# Patient Record
Sex: Female | Born: 1955 | Race: White | Hispanic: No | Marital: Married | Smoking: Never smoker
Health system: Southern US, Community
[De-identification: ages and names within clinical notes are randomized; demographics above are authoritative.]

## PROBLEM LIST (undated history)

## (undated) DIAGNOSIS — R011 Cardiac murmur, unspecified: Secondary | ICD-10-CM

## (undated) DIAGNOSIS — C801 Malignant (primary) neoplasm, unspecified: Secondary | ICD-10-CM

## (undated) DIAGNOSIS — H269 Unspecified cataract: Secondary | ICD-10-CM

## (undated) DIAGNOSIS — M1990 Unspecified osteoarthritis, unspecified site: Secondary | ICD-10-CM

## (undated) DIAGNOSIS — R55 Syncope and collapse: Secondary | ICD-10-CM

## (undated) HISTORY — DX: Cardiac murmur, unspecified: R01.1

## (undated) HISTORY — DX: Unspecified cataract: H26.9

## (undated) HISTORY — DX: Syncope and collapse: R55

## (undated) HISTORY — DX: Malignant (primary) neoplasm, unspecified: C80.1

## (undated) HISTORY — DX: Unspecified osteoarthritis, unspecified site: M19.90

---

## 2001-05-18 ENCOUNTER — Encounter: Admission: RE | Admit: 2001-05-18 | Discharge: 2001-05-18 | Payer: Self-pay | Admitting: *Deleted

## 2001-05-18 ENCOUNTER — Encounter: Payer: Self-pay | Admitting: *Deleted

## 2001-07-13 ENCOUNTER — Other Ambulatory Visit: Admission: RE | Admit: 2001-07-13 | Discharge: 2001-07-13 | Payer: Self-pay | Admitting: *Deleted

## 2003-09-24 ENCOUNTER — Encounter: Admission: RE | Admit: 2003-09-24 | Discharge: 2003-09-24 | Payer: Self-pay | Admitting: *Deleted

## 2006-01-19 ENCOUNTER — Emergency Department (HOSPITAL_COMMUNITY): Admission: EM | Admit: 2006-01-19 | Discharge: 2006-01-19 | Payer: Self-pay | Admitting: *Deleted

## 2006-05-24 ENCOUNTER — Encounter: Admission: RE | Admit: 2006-05-24 | Discharge: 2006-05-24 | Payer: Self-pay | Admitting: *Deleted

## 2006-05-31 ENCOUNTER — Other Ambulatory Visit: Admission: RE | Admit: 2006-05-31 | Discharge: 2006-05-31 | Payer: Self-pay | Admitting: *Deleted

## 2009-01-07 HISTORY — PX: COLONOSCOPY: SHX174

## 2009-01-11 ENCOUNTER — Encounter: Payer: Self-pay | Admitting: Internal Medicine

## 2009-02-01 ENCOUNTER — Ambulatory Visit (HOSPITAL_COMMUNITY): Admission: RE | Admit: 2009-02-01 | Discharge: 2009-02-01 | Payer: Self-pay | Admitting: Internal Medicine

## 2009-02-01 ENCOUNTER — Ambulatory Visit: Payer: Self-pay | Admitting: Internal Medicine

## 2009-02-14 ENCOUNTER — Encounter: Admission: RE | Admit: 2009-02-14 | Discharge: 2009-02-14 | Payer: Self-pay | Admitting: Family Medicine

## 2011-03-24 NOTE — Op Note (Signed)
NAME:  Claire [name], Claire [name]            ACCOUNT NO.:  [id number]   MEDICAL RECORD NO.:  [id number]          PATIENT TYPE:  AMB   LOCATION:  DAY                           FACILITY:  [facility]   PHYSICIAN:  R. Roetta Sessions, M.D. DATE OF BIRTH:  [birth year]   DATE OF PROCEDURE:  02/01/2009  DATE OF DISCHARGE:                               OPERATIVE REPORT   PROCEDURE PERFORMED:  Screening colonoscopy.   INDICATIONS FOR PROCEDURE:  55 year old lady sent over courtesy of Dr.  Lilyan Punt for colorectal cancer screening.  She is devoid of any  lower GI tract symptoms.  There was no family history of any polyps or  colon cancer in any first-degree relatives.  She has never had her lower  GI tract imaged previously.  Colonoscopy is now being done.  Risks,  benefits, alternatives and limitations have been reviewed, questions  answered.  She is agreeable.  Please see documentation in the medical  record.   PROCEDURE NOTE:  O2 saturation, blood pressure, pulse and respirations  were monitored throughout the entire procedure.   CONSCIOUS SEDATION:  Versed 5 mg IV, Demerol 100 mg IV in divided doses.   INSTRUMENT:  Pentax video chip system.   FINDINGS:  Digital rectal exam revealed no abnormalities.   ENDOSCOPIC FINDINGS:  The prep was good.  Colon:  Colonic mucosa was surveyed from rectosigmoid junction through  the left, transverse, right colon to the area of the appendiceal  orifice, ileocecal valve/cecum.  These structures were well seen and  photographed for the record.  From this level, the scope was slowly  withdrawn.  All previous mentioned mucosal surfaces were again seen and  the colonic mucosa appeared entirely normal.  Scope was pulled down to  the rectum where a thorough examination of the rectal mucosa including  retroflex view of the anal verge demonstrated no abnormalities.  The  patient tolerated the procedure well and was reacted in endoscopy.  The  cecal withdrawal time over 6  minutes.   IMPRESSION:  1. Normal rectum.  2. Normal colon.   RECOMMENDATIONS:  Repeat screening colonoscopy in 10 years.      Jonathon Bellows, M.D.  Electronically Signed     RMR/MEDQ  D:  02/01/2009  T:  02/01/2009  Job:  [id number]   cc:   Lorin Picket A. Gerda Diss, MD  Fax: [phone]

## 2011-06-30 ENCOUNTER — Other Ambulatory Visit: Payer: Self-pay | Admitting: Family Medicine

## 2011-06-30 DIAGNOSIS — Z1231 Encounter for screening mammogram for malignant neoplasm of breast: Secondary | ICD-10-CM

## 2011-07-01 ENCOUNTER — Ambulatory Visit
Admission: RE | Admit: 2011-07-01 | Discharge: 2011-07-01 | Disposition: A | Discharge status: Home / Self Care | Payer: BC Managed Care – PPO | Source: Ambulatory Visit | Attending: Family Medicine | Admitting: Family Medicine

## 2011-07-01 DIAGNOSIS — Z1231 Encounter for screening mammogram for malignant neoplasm of breast: Secondary | ICD-10-CM

## 2013-01-14 ENCOUNTER — Encounter: Payer: Self-pay | Admitting: *Deleted

## 2013-05-05 ENCOUNTER — Encounter: Payer: Self-pay | Admitting: Family Medicine

## 2013-05-05 ENCOUNTER — Ambulatory Visit: Payer: Self-pay | Admitting: Nurse Practitioner

## 2013-05-05 ENCOUNTER — Ambulatory Visit (INDEPENDENT_AMBULATORY_CARE_PROVIDER_SITE_OTHER): Payer: No Typology Code available for payment source | Admitting: Family Medicine

## 2013-05-05 VITALS — BP 132/74 | Temp 97.6°F | Wt 132.1 lb

## 2013-05-05 DIAGNOSIS — J329 Chronic sinusitis, unspecified: Secondary | ICD-10-CM

## 2013-05-05 MED ORDER — AMOXICILLIN-POT CLAVULANATE 875-125 MG PO TABS: 1.0000 | ORAL_TABLET | Freq: Two times a day (BID) | ORAL | Status: AC

## 2013-05-05 NOTE — Progress Notes (Signed)
  Subjective:    Patient ID: Claire [name], female    DOB: 02-Apr-1956, 57 y.o.   MRN: [id number]  Headache  This is a new problem. The current episode started 1 to 4 weeks ago. The problem has been gradually worsening. The pain is located in the retro-orbital region. The pain radiates to the face. The pain quality is not similar to prior headaches. The quality of the pain is described as aching. The pain is at a severity of 5/10. The pain is moderate. Associated symptoms include facial sweating. She has tried nothing for the symptoms. The treatment provided moderate relief. There is no history of cluster headaches.   occas drainage. Doesn't feel like a hormnal headache. Not as bad now . Right now less congestion  More pressure I guess.  Not pounding. No assoc nausea  Not as clear, not as good vision.   Review of Systems  Neurological: Positive for headaches.   some drainage. No chest pain no sore throat no fever good energy ROS otherwise negative     Objective:   Physical Exam  Alert no acute distress. HEENT frontal and maxillary tenderness on right side. Slight nasal congestion. Pharynx normal neck supple. Lungs clear. Heart regular rate and rhythm.      Assessment & Plan:  Impression 1 rhinosinusitis. Plan Augmentin twice a day 10 days. Symptomatic care discussed. Expect gradual resolution. WSL

## 2016-04-23 ENCOUNTER — Ambulatory Visit (INDEPENDENT_AMBULATORY_CARE_PROVIDER_SITE_OTHER): Payer: No Typology Code available for payment source | Admitting: Nurse Practitioner

## 2016-04-23 ENCOUNTER — Emergency Department (HOSPITAL_COMMUNITY): Payer: No Typology Code available for payment source

## 2016-04-23 ENCOUNTER — Encounter: Payer: Self-pay | Admitting: Nurse Practitioner

## 2016-04-23 ENCOUNTER — Observation Stay (HOSPITAL_COMMUNITY)
Admission: EM | Admit: 2016-04-23 | Discharge: 2016-04-24 | Disposition: A | Discharge status: Home / Self Care | Payer: No Typology Code available for payment source | Attending: General Surgery | Admitting: General Surgery

## 2016-04-23 ENCOUNTER — Encounter (HOSPITAL_COMMUNITY): Payer: Self-pay

## 2016-04-23 VITALS — BP 136/76 | Temp 98.6°F | Wt 126.5 lb

## 2016-04-23 DIAGNOSIS — Z803 Family history of malignant neoplasm of breast: Secondary | ICD-10-CM | POA: Diagnosis not present

## 2016-04-23 DIAGNOSIS — R1011 Right upper quadrant pain: Secondary | ICD-10-CM | POA: Diagnosis not present

## 2016-04-23 DIAGNOSIS — K8000 Calculus of gallbladder with acute cholecystitis without obstruction: Principal | ICD-10-CM | POA: Insufficient documentation

## 2016-04-23 DIAGNOSIS — Z800 Family history of malignant neoplasm of digestive organs: Secondary | ICD-10-CM | POA: Diagnosis not present

## 2016-04-23 DIAGNOSIS — K810 Acute cholecystitis: Secondary | ICD-10-CM | POA: Diagnosis present

## 2016-04-23 LAB — COMPREHENSIVE METABOLIC PANEL
ALBUMIN: 4.4 g/dL (ref 3.5–5.0)
ALT: 43 U/L (ref 14–54)
ANION GAP: 9 (ref 5–15)
AST: 28 U/L (ref 15–41)
Alkaline Phosphatase: 43 U/L (ref 38–126)
BUN: 12 mg/dL (ref 6–20)
CHLORIDE: 98 mmol/L — AB (ref 101–111)
CO2: 27 mmol/L (ref 22–32)
Calcium: 9.1 mg/dL (ref 8.9–10.3)
Creatinine, Ser: 0.56 mg/dL (ref 0.44–1.00)
GFR calc Af Amer: >60 mL/min (ref >60)
GFR calc non Af Amer: >60 mL/min (ref >60)
GLUCOSE: 117 mg/dL — AB (ref 65–99)
POTASSIUM: 3.7 mmol/L (ref 3.5–5.1)
SODIUM: 134 mmol/L — AB (ref 135–145)
TOTAL PROTEIN: 7.6 g/dL (ref 6.5–8.1)
Total Bilirubin: 0.7 mg/dL (ref 0.3–1.2)

## 2016-04-23 LAB — CBC WITH DIFFERENTIAL/PLATELET
BASOS ABS: 0 10*3/uL (ref 0.0–0.1)
BASOS PCT: 0 %
EOS ABS: 0 10*3/uL (ref 0.0–0.7)
Eosinophils Relative: 0 %
HCT: 41.7 % (ref 36.0–46.0)
Hemoglobin: 13.7 g/dL (ref 12.0–15.0)
Lymphocytes Relative: 7 %
Lymphs Abs: 0.8 10*3/uL (ref 0.7–4.0)
MCH: 28.9 pg (ref 26.0–34.0)
MCHC: 32.9 g/dL (ref 30.0–36.0)
MCV: 88 fL (ref 78.0–100.0)
MONO ABS: 0.2 10*3/uL (ref 0.1–1.0)
MONOS PCT: 2 %
NEUTROS ABS: 10.2 10*3/uL — AB (ref 1.7–7.7)
Neutrophils Relative %: 91 %
PLATELETS: 226 10*3/uL (ref 150–400)
RBC: 4.74 MIL/uL (ref 3.87–5.11)
RDW: 13.2 % (ref 11.5–15.5)
WBC: 11.3 10*3/uL — ABNORMAL HIGH (ref 4.0–10.5)

## 2016-04-23 LAB — URINALYSIS, ROUTINE W REFLEX MICROSCOPIC
Bilirubin Urine: NEGATIVE
GLUCOSE, UA: NEGATIVE mg/dL
KETONES UR: 40 mg/dL — AB
LEUKOCYTES UA: NEGATIVE
NITRITE: NEGATIVE
PROTEIN: 100 mg/dL — AB
Specific Gravity, Urine: >1.03 — ABNORMAL HIGH (ref 1.005–1.030)
pH: 6 (ref 5.0–8.0)

## 2016-04-23 LAB — URINE MICROSCOPIC-ADD ON

## 2016-04-23 LAB — LIPASE, BLOOD: Lipase: 24 U/L (ref 11–51)

## 2016-04-23 MED ORDER — MORPHINE SULFATE (PF) 4 MG/ML IV SOLN
4.0000 mg | INTRAVENOUS | Status: DC | PRN
  Administered 2016-04-23: 4 mg via INTRAVENOUS
  Filled 2016-04-23: qty 1

## 2016-04-23 MED ORDER — MORPHINE SULFATE (PF) 4 MG/ML IV SOLN
4.0000 mg | INTRAVENOUS | Status: AC | PRN
  Administered 2016-04-23: 4 mg via INTRAVENOUS
  Filled 2016-04-23: qty 1

## 2016-04-23 MED ORDER — SODIUM CHLORIDE 0.9 % IV SOLN
INTRAVENOUS | Status: AC
  Administered 2016-04-23: 21:00:00 via INTRAVENOUS

## 2016-04-23 MED ORDER — ACETAMINOPHEN 325 MG PO TABS: 650.0000 mg | ORAL_TABLET | Freq: Four times a day (QID) | ORAL | Status: DC | PRN

## 2016-04-23 MED ORDER — ONDANSETRON HCL 4 MG/2ML IJ SOLN
4.0000 mg | Freq: Three times a day (TID) | INTRAMUSCULAR | Status: AC | PRN
Start: 2016-04-23 — End: 2016-04-24

## 2016-04-23 MED ORDER — CIPROFLOXACIN IN D5W 400 MG/200ML IV SOLN
400.0000 mg | Freq: Once | INTRAVENOUS | Status: AC
  Administered 2016-04-23: 400 mg via INTRAVENOUS
  Filled 2016-04-23: qty 200

## 2016-04-23 MED ORDER — SODIUM CHLORIDE 0.9 % IV SOLN
INTRAVENOUS | Status: DC
  Administered 2016-04-23 – 2016-04-24 (×3): via INTRAVENOUS

## 2016-04-23 MED ORDER — ONDANSETRON HCL 4 MG/2ML IJ SOLN
4.0000 mg | INTRAMUSCULAR | Status: AC | PRN
  Administered 2016-04-23 (×2): 4 mg via INTRAVENOUS
  Filled 2016-04-23 (×2): qty 2

## 2016-04-23 MED ORDER — METRONIDAZOLE IN NACL 5-0.79 MG/ML-% IV SOLN
500.0000 mg | Freq: Three times a day (TID) | INTRAVENOUS | Status: DC
  Administered 2016-04-24 (×2): 500 mg via INTRAVENOUS
  Filled 2016-04-23 (×3): qty 100

## 2016-04-23 MED ORDER — CIPROFLOXACIN IN D5W 400 MG/200ML IV SOLN
400.0000 mg | Freq: Two times a day (BID) | INTRAVENOUS | Status: DC
  Administered 2016-04-24: 400 mg via INTRAVENOUS
  Filled 2016-04-23 (×2): qty 200

## 2016-04-23 MED ORDER — HYDROMORPHONE HCL 1 MG/ML IJ SOLN
1.0000 mg | INTRAMUSCULAR | Status: AC | PRN
  Administered 2016-04-24 (×2): 1 mg via INTRAVENOUS
  Filled 2016-04-23 (×2): qty 1

## 2016-04-23 MED ORDER — METRONIDAZOLE IN NACL 5-0.79 MG/ML-% IV SOLN
500.0000 mg | Freq: Once | INTRAVENOUS | Status: AC
  Administered 2016-04-23: 500 mg via INTRAVENOUS
  Filled 2016-04-23: qty 100

## 2016-04-23 MED ORDER — ACETAMINOPHEN 650 MG RE SUPP: 650.0000 mg | Freq: Four times a day (QID) | RECTAL | Status: DC | PRN

## 2016-04-23 MED ORDER — FAMOTIDINE IN NACL 20-0.9 MG/50ML-% IV SOLN
20.0000 mg | Freq: Once | INTRAVENOUS | Status: AC
  Administered 2016-04-23: 20 mg via INTRAVENOUS
  Filled 2016-04-23: qty 50

## 2016-04-23 NOTE — Progress Notes (Signed)
Subjective:  Presents with her husband for c/o upper abd pain especially in the RUQ that began suddenly at 5 pm yesterday. Several episodes of vomiting, mostly bile and phlegm. No known contacts. No fever. No back pain. BMs normal. No urinary symptoms. Postmenopausal, no cycle.   Objective:   BP 136/76 mmHg  Temp(Src) 98.6 F (37 C) (Oral)  Wt 126 lb 8 oz (57.38 kg) NAD. Alert, oriented. Lungs clear. Heart RRR. Abdomen soft, non distended with hypoactive BS x 4. Moderate epigastric area tenderness. Distinct RUQ tenderness even with light palpation.   Assessment: Right upper quadrant pain  Plan: Feel patient needs stat labs and work up. Referred to local ED for evaluation of acute abdominal pain. She and her husband agree with this plan.

## 2016-04-23 NOTE — ED Notes (Addendum)
Pt sent from [place].  Provider reports pt c/o severe ruq pain and is tender to touch.  Reports n/v.  Pt says symptoms started around 5:00pm yesterday.  Provider says she is unable to get back stat labs this late in the day and thinks pt may need some IV fluid.  Denies diarrhea.

## 2016-04-23 NOTE — ED Provider Notes (Signed)
CSN: [id number]     Arrival date & time 04/23/16  1513 History   First MD Initiated Contact with Patient 04/23/16 1525     No chief complaint on file.    HPI Pt was seen at 1525.  Per pt, c/o gradual onset and worsening of constant RUQ abd "pain" since yesterday evening.  Has been associated with multiple intermittent episodes of N/V.  Describes the abd pain as "sharp." Pt has been unable to tol PO due to her symptoms. Denies diarrhea, no fevers, no back pain, no rash, no CP/SOB, no black or blood in stools. Pt was evaluated by her PMD PTA, then sent to the ED for further evaluation.      History reviewed. No pertinent past medical history.   Past Surgical History  Procedure Laterality Date  . Colonoscopy  March 2010    normal repeat 10 years  . Cesarean section     Family History  Problem Relation Age of Onset  . Hyperlipidemia Father   . Cancer Maternal Grandmother     breast  . Cancer Paternal Grandfather     colon   Social History  Substance Use Topics  . Smoking status: Never Smoker   . Smokeless tobacco: None  . Alcohol Use: No    Review of Systems ROS: Statement: All systems negative except as marked or noted in the HPI; Constitutional: Negative for fever and chills. ; ; Eyes: Negative for eye pain, redness and discharge. ; ; ENMT: Negative for ear pain, hoarseness, nasal congestion, sinus pressure and sore throat. ; ; Cardiovascular: Negative for chest pain, palpitations, diaphoresis, dyspnea and peripheral edema. ; ; Respiratory: Negative for cough, wheezing and stridor. ; ; Gastrointestinal: +N/V, abd pain. Negative for diarrhea, blood in stool, hematemesis, jaundice and rectal bleeding. . ; ; Genitourinary: Negative for dysuria, flank pain and hematuria. ; ; Musculoskeletal: Negative for back pain and neck pain. Negative for swelling and trauma.; ; Skin: Negative for pruritus, rash, abrasions, blisters, bruising and skin lesion.; ; Neuro: Negative for headache,  lightheadedness and neck stiffness. Negative for weakness, altered level of consciousness, altered mental status, extremity weakness, paresthesias, involuntary movement, seizure and syncope.     Allergies  Review of patient's allergies indicates no known allergies.  Home Medications   Prior to Admission medications   Medication Sig Start Date End Date Taking? Authorizing Provider  COD LIVER OIL PO Take by mouth. Reported on 04/23/2016    Historical Provider, MD  fish oil-omega-3 fatty acids 1000 MG capsule Take 1 g by mouth daily.    Historical Provider, MD  GLUCOSAMINE PO Take by mouth.    Historical Provider, MD  Multiple Vitamin (MULTIVITAMIN) tablet Take 1 tablet by mouth daily.    Historical Provider, MD   BP 136/64 mmHg  Pulse 70  Temp(Src) 98.7 F (37.1 C) (Oral)  Resp 16  Ht 5\' 2"  (1.575 m)  Wt 126 lb (57.153 kg)  BMI 23.04 kg/m2  SpO2 96% Physical Exam  1530: Physical examination:  Nursing notes reviewed; Vital signs and O2 SAT reviewed;  Constitutional: Well developed, Well nourished, Well hydrated, Uncomfortable appearing.; Head:  Normocephalic, atraumatic; Eyes: EOMI, PERRL, No scleral icterus; ENMT: Mouth and pharynx normal, Mucous membranes moist; Neck: Supple, Full range of motion, No lymphadenopathy; Cardiovascular: Regular rate and rhythm, No gallop; Respiratory: Breath sounds clear & equal bilaterally, No wheezes.  Speaking full sentences with ease, Normal respiratory effort/excursion; Chest: Nontender, Movement normal; Abdomen: Soft, +RUQ and mid-epigastric areas tender to palp.  Nondistended, Normal bowel sounds; Genitourinary: No CVA tenderness; Extremities: Pulses normal, No tenderness, No edema, No calf edema or asymmetry.; Neuro: AA&Ox3, Major CN grossly intact.  Speech clear. No gross focal motor or sensory deficits in extremities.; Skin: Color normal, Warm, Dry.   ED Course  Procedures (including critical care time) Labs Review   Imaging Review  I have  personally reviewed and evaluated these images and lab results as part of my medical decision-making.   EKG Interpretation None      MDM  MDM Reviewed: previous chart, nursing note and vitals Reviewed previous: labs Interpretation: labs, ultrasound and x-ray     Results for orders placed or performed during the hospital encounter of 04/23/16  CBC with Differential  Result Value Ref Range   WBC 11.3 (H) 4.0 - 10.5 K/uL   RBC 4.74 3.87 - 5.11 MIL/uL   Hemoglobin 13.7 12.0 - 15.0 g/dL   HCT 41.7 36.0 - 46.0 %   MCV 88.0 78.0 - 100.0 fL   MCH 28.9 26.0 - 34.0 pg   MCHC 32.9 30.0 - 36.0 g/dL   RDW 13.2 11.5 - 15.5 %   Platelets 226 150 - 400 K/uL   Neutrophils Relative % 91 %   Neutro Abs 10.2 (H) 1.7 - 7.7 K/uL   Lymphocytes Relative 7 %   Lymphs Abs 0.8 0.7 - 4.0 K/uL   Monocytes Relative 2 %   Monocytes Absolute 0.2 0.1 - 1.0 K/uL   Eosinophils Relative 0 %   Eosinophils Absolute 0.0 0.0 - 0.7 K/uL   Basophils Relative 0 %   Basophils Absolute 0.0 0.0 - 0.1 K/uL  Comprehensive metabolic panel  Result Value Ref Range   Sodium 134 (L) 135 - 145 mmol/L   Potassium 3.7 3.5 - 5.1 mmol/L   Chloride 98 (L) 101 - 111 mmol/L   CO2 27 22 - 32 mmol/L   Glucose, Bld 117 (H) 65 - 99 mg/dL   BUN 12 6 - 20 mg/dL   Creatinine, Ser 0.56 0.44 - 1.00 mg/dL   Calcium 9.1 8.9 - 10.3 mg/dL   Total Protein 7.6 6.5 - 8.1 g/dL   Albumin 4.4 3.5 - 5.0 g/dL   AST 28 15 - 41 U/L   ALT 43 14 - 54 U/L   Alkaline Phosphatase 43 38 - 126 U/L   Total Bilirubin 0.7 0.3 - 1.2 mg/dL   GFR calc non Af Amer >60 >60 mL/min   GFR calc Af Amer >60 >60 mL/min   Anion gap 9 5 - 15  Lipase, blood  Result Value Ref Range   Lipase 24 11 - 51 U/L   Dg Chest 2 View 04/23/2016  CLINICAL DATA:  Severe right upper quadrant pain EXAM: CHEST  2 VIEW COMPARISON:  None. FINDINGS: The heart size and mediastinal contours are within normal limits. Both lungs are clear. The visualized skeletal structures are  unremarkable. IMPRESSION: No active cardiopulmonary disease. Electronically Signed   By: Inez Catalina M.D.   On: 04/23/2016 17:15   US Abdomen Complete 04/23/2016  CLINICAL DATA:  RIGHT upper quadrant abdominal pain for 24 hours, nausea, vomiting EXAM: ABDOMEN ULTRASOUND COMPLETE COMPARISON:  None FINDINGS: Gallbladder: 27 mm diameter non mobile calculus at lower gallbladder segment/neck. Associated gallbladder wall thickening, pericholecystic fluid and sonographic Murphy sign. Small amount of sludge within gallbladder. Findings consistent with acute cholecystitis. Common bile duct: Diameter: Not adequately visualized due to shadowing from gallstone at lower gallbladder segment/neck. Liver: Normal parenchymal echogenicity. Minimal intrahepatic  biliary dilatation. No focal hepatic mass. Hepatopetal portal venous flow. IVC: Normal appearance Pancreas: Normal appearance Spleen: Normal appearance, 6.7 cm length Right Kidney: Length: 11.3 cm. Normal morphology without mass or hydronephrosis. Left Kidney: Length: 11.8 cm. Normal morphology without mass or hydronephrosis. Abdominal aorta: Normal caliber Other findings: No free fluid IMPRESSION: Cholelithiasis with gallbladder sludge, gallbladder wall thickening, pericholecystic fluid and sonographic Murphy sign consistent with acute cholecystitis. Suboptimal visualization of extrahepatic biliary tree/CBD though mild intrahepatic biliary dilatation is identified; recommend correlation with LFTs. Electronically Signed   By: Lavonia Dana M.D.   On: 04/23/2016 17:13    1745:  [place] with acute cholecystitis; IV abx ordered. Dx and testing d/w pt and family.  Questions answered.  Verb understanding, agreeable to admit. T/C to General Surgery Dr. Arnoldo Morale, case discussed, including:  HPI, pertinent PM/SHx, VS/PE, dx testing, ED course and treatment:  Agreeable to consult, requests to admit to [facility]. T/C to [facility] Dr. Nehemiah Settle, case discussed, including:  HPI, pertinent  PM/SHx, VS/PE, dx testing, ED course and treatment:  Agreeable to admit, requests to write temporary orders, obtain medical bed to team [facility].   Francine Graven, DO 04/25/16 0117

## 2016-04-23 NOTE — H&P (Signed)
History and Physical  [name] [id number] DOB: 1956/02/15 DOA: 04/23/2016  Referring physician: Dr Thurnell Garbe, ED physician PCP: Sallee Lange, MD  Outpatient Specialists: none  Chief Complaint: Right upper quadrant pain  HPI: Claire [name] is a 60 y.o. female who is otherwise healthy. Patient presents with right upper quadrant pain that started yesterday and gradually became worse today. No palliating factors other than pain medicine given in the emergency department. No provoking factors. No nausea and vomiting, although patient does not feel like eating. Denies fevers. Denies chest pain, shortness of breath.  Patient leads a fairly active lifestyle and runs up to a few times a week.   Review of Systems:   Pt denies any fevers, chills, nausea, vomiting, diarrhea, constipation, abdominal pain, shortness of breath, dyspnea on exertion, orthopnea, cough, wheezing, palpitations, headache, vision changes, lightheadedness, dizziness, melena, rectal bleeding.  Review of systems are otherwise negative  History reviewed. No pertinent past medical history. Past Surgical History  Procedure Laterality Date  . Colonoscopy  March 2010    normal repeat 10 years  . Cesarean section     Social History:  reports that she has never smoked. She does not have any smokeless tobacco history on file. She reports that she does not drink alcohol or use illicit drugs. Patient lives at Home  No Known Allergies  Family History  Problem Relation Age of Onset  . Hyperlipidemia Father   . Cancer Maternal Grandmother     breast  . Cancer Paternal Grandfather     colon     Prior to Admission medications   Medication Sig Start Date End Date Taking? Authorizing Provider  fish oil-omega-3 fatty acids 1000 MG capsule Take 1 g by mouth daily.   Yes Historical Provider, MD  GLUCOSAMINE PO Take 1 tablet by mouth 2 (two) times daily.    Yes Historical Provider, MD  Multiple Vitamin (MULTIVITAMIN)  tablet Take 1 tablet by mouth daily.   Yes Historical Provider, MD    Physical Exam: BP 140/77 mmHg  Pulse 71  Temp(Src) 98.7 F (37.1 C) (Oral)  Resp 16  Ht 5\' 2"  (1.575 m)  Wt 57.153 kg (126 lb)  BMI 23.04 kg/m2  SpO2 99%  General: Middle-aged Caucasian female. Awake and alert and oriented x3. No acute cardiopulmonary distress.  HEENT: Normocephalic atraumatic.  Right and left ears normal in appearance.  Pupils equal, round, reactive to light. Extraocular muscles are intact. Sclerae anicteric and noninjected.  Moist mucosal membranes. No mucosal lesions.  Neck: Neck supple without lymphadenopathy. No carotid bruits. No masses palpated.  Cardiovascular: Regular rate with normal S1-S2 sounds. 3/6 systolic ejection murmur. No rubs, gallops auscultated. No JVD.  Respiratory: Good respiratory effort with no wheezes, rales, rhonchi. Lungs clear to auscultation bilaterally.  No accessory muscle use. Abdomen: Soft, tender in the right upper quadrant with guarding. Nondistended. No rebound tenderness. Active bowel sounds. No masses or hepatosplenomegaly  Skin: No rashes, lesions, or ulcerations.  Dry, warm to touch. 2+ dorsalis pedis and radial pulses. Musculoskeletal: No calf or leg pain. All major joints not erythematous nontender.  No upper or lower joint deformation.  Good ROM.  No contractures  Psychiatric: Intact judgment and insight. Pleasant and cooperative. Neurologic: No focal neurological deficits. Strength is 5/5 and symmetric in upper and lower extremities.  Cranial nerves II through XII are grossly intact.           Labs on Admission: I have personally reviewed following labs and imaging studies  CBC:  Recent Labs Lab 04/23/16 1547  WBC 11.3*  NEUTROABS 10.2*  HGB 13.7  HCT 41.7  MCV 88.0  PLT [id number]   Basic Metabolic Panel:  Recent Labs Lab 04/23/16 1547  NA 134*  K 3.7  CL 98*  CO2 27  GLUCOSE 117*  BUN 12  CREATININE 0.56  CALCIUM 9.1   GFR: Estimated  Creatinine Clearance: 60.6 mL/min (by C-G formula based on Cr of 0.56). Liver Function Tests:  Recent Labs Lab 04/23/16 1547  AST 28  ALT 43  ALKPHOS 43  BILITOT 0.7  PROT 7.6  ALBUMIN 4.4    Recent Labs Lab 04/23/16 1547  LIPASE 24   No results for input(s): AMMONIA in the last 168 hours. Coagulation Profile: No results for input(s): INR, PROTIME in the last 168 hours. Cardiac Enzymes: No results for input(s): CKTOTAL, CKMB, CKMBINDEX, TROPONINI in the last 168 hours. BNP (last 3 results) No results for input(s): PROBNP in the last 8760 hours. HbA1C: No results for input(s): HGBA1C in the last 72 hours. CBG: No results for input(s): GLUCAP in the last 168 hours. Lipid Profile: No results for input(s): CHOL, HDL, LDLCALC, TRIG, CHOLHDL, LDLDIRECT in the last 72 hours. Thyroid Function Tests: No results for input(s): TSH, T4TOTAL, FREET4, T3FREE, THYROIDAB in the last 72 hours. Anemia Panel: No results for input(s): VITAMINB12, FOLATE, FERRITIN, TIBC, IRON, RETICCTPCT in the last 72 hours. Urine analysis:    Component Value Date/Time   COLORURINE YELLOW 04/23/2016 [place] 04/23/2016 1645   LABSPEC >1.030* 04/23/2016 1645   PHURINE 6.0 04/23/2016 [place] 04/23/2016 1645   HGBUR SMALL* 04/23/2016 [place] 04/23/2016 1645   KETONESUR 40* 04/23/2016 1645   PROTEINUR 100* 04/23/2016 1645   NITRITE NEGATIVE 04/23/2016 1645   LEUKOCYTESUR NEGATIVE 04/23/2016 1645   Sepsis Labs: @LABRCNTIP (procalcitonin:4,lacticidven:4) )No results found for this or any previous visit (from the past 240 hour(s)).   Radiological Exams on Admission: Dg Chest 2 View  04/23/2016  CLINICAL DATA:  Severe right upper quadrant pain EXAM: CHEST  2 VIEW COMPARISON:  None. FINDINGS: The heart size and mediastinal contours are within normal limits. Both lungs are clear. The visualized skeletal structures are unremarkable. IMPRESSION: No active  cardiopulmonary disease. Electronically Signed   By: Inez Catalina M.D.   On: 04/23/2016 17:15   US Abdomen Complete  04/23/2016  CLINICAL DATA:  RIGHT upper quadrant abdominal pain for 24 hours, nausea, vomiting EXAM: ABDOMEN ULTRASOUND COMPLETE COMPARISON:  None FINDINGS: Gallbladder: 27 mm diameter non mobile calculus at lower gallbladder segment/neck. Associated gallbladder wall thickening, pericholecystic fluid and sonographic Murphy sign. Small amount of sludge within gallbladder. Findings consistent with acute cholecystitis. Common bile duct: Diameter: Not adequately visualized due to shadowing from gallstone at lower gallbladder segment/neck. Liver: Normal parenchymal echogenicity. Minimal intrahepatic biliary dilatation. No focal hepatic mass. Hepatopetal portal venous flow. IVC: Normal appearance Pancreas: Normal appearance Spleen: Normal appearance, 6.7 cm length Right Kidney: Length: 11.3 cm. Normal morphology without mass or hydronephrosis. Left Kidney: Length: 11.8 cm. Normal morphology without mass or hydronephrosis. Abdominal aorta: Normal caliber Other findings: No free fluid IMPRESSION: Cholelithiasis with gallbladder sludge, gallbladder wall thickening, pericholecystic fluid and sonographic Murphy sign consistent with acute cholecystitis. Suboptimal visualization of extrahepatic biliary tree/CBD though mild intrahepatic biliary dilatation is identified; recommend correlation with LFTs. Electronically Signed   By: Lavonia Dana M.D.   On: 04/23/2016 17:13    Assessment/Plan: Active Problems:   Acute cholecystitis  This patient was discussed with the ED physician, including pertinent vitals, physical exam findings, labs, and imaging.  We also discussed care given by the ED provider.  #1 acute cholecystitis  Admit  Cipro, Flagyl  Repeat blood work in the morning  EKG  Nothing by mouth after midnight for surgery tomorrow  DVT prophylaxis: SCDs Consultants: Gen. surgery Code  Status: Full code Family Communication: None  Disposition Plan: Admit   Truett Mainland, DO [facility] Pager [phone]  If 7PM-7AM, please contact night-coverage [email or web address] Password [email or web address]

## 2016-04-24 ENCOUNTER — Observation Stay (HOSPITAL_COMMUNITY): Payer: No Typology Code available for payment source | Admitting: Anesthesiology

## 2016-04-24 ENCOUNTER — Encounter (HOSPITAL_COMMUNITY): Payer: Self-pay | Admitting: *Deleted

## 2016-04-24 ENCOUNTER — Encounter (HOSPITAL_COMMUNITY)
Admission: EM | Disposition: A | Discharge status: Home / Self Care | Payer: Self-pay | Source: Home / Self Care | Attending: Emergency Medicine

## 2016-04-24 HISTORY — PX: CHOLECYSTECTOMY: SHX55

## 2016-04-24 LAB — CBC
HEMATOCRIT: 38.5 % (ref 36.0–46.0)
Hemoglobin: 12.7 g/dL (ref 12.0–15.0)
MCH: 29.5 pg (ref 26.0–34.0)
MCHC: 33 g/dL (ref 30.0–36.0)
MCV: 89.5 fL (ref 78.0–100.0)
PLATELETS: 203 10*3/uL (ref 150–400)
RBC: 4.3 MIL/uL (ref 3.87–5.11)
RDW: 13.4 % (ref 11.5–15.5)
WBC: 11.2 10*3/uL — ABNORMAL HIGH (ref 4.0–10.5)

## 2016-04-24 LAB — BASIC METABOLIC PANEL
Anion gap: 7 (ref 5–15)
BUN: 9 mg/dL (ref 6–20)
CHLORIDE: 103 mmol/L (ref 101–111)
CO2: 28 mmol/L (ref 22–32)
CREATININE: 0.47 mg/dL (ref 0.44–1.00)
Calcium: 8.2 mg/dL — ABNORMAL LOW (ref 8.9–10.3)
GFR calc Af Amer: >60 mL/min (ref >60)
GFR calc non Af Amer: >60 mL/min (ref >60)
GLUCOSE: 122 mg/dL — AB (ref 65–99)
POTASSIUM: 3.8 mmol/L (ref 3.5–5.1)
SODIUM: 138 mmol/L (ref 135–145)

## 2016-04-24 LAB — SURGICAL PCR SCREEN
MRSA, PCR: NEGATIVE
Staphylococcus aureus: NEGATIVE

## 2016-04-24 SURGERY — LAPAROSCOPIC CHOLECYSTECTOMY
Anesthesia: General

## 2016-04-24 MED ORDER — GLYCOPYRROLATE 0.2 MG/ML IJ SOLN
INTRAMUSCULAR | Status: DC | PRN
  Administered 2016-04-24: 0.6 mg via INTRAVENOUS

## 2016-04-24 MED ORDER — FENTANYL CITRATE (PF) 100 MCG/2ML IJ SOLN: 25.0000 ug | INTRAMUSCULAR | Status: DC | PRN

## 2016-04-24 MED ORDER — ONDANSETRON HCL 4 MG/2ML IJ SOLN
INTRAMUSCULAR | Status: AC
  Filled 2016-04-24: qty 2

## 2016-04-24 MED ORDER — BUPIVACAINE HCL (PF) 0.5 % IJ SOLN
INTRAMUSCULAR | Status: AC
Start: 2016-04-24 — End: 2016-04-24
  Filled 2016-04-24: qty 30

## 2016-04-24 MED ORDER — ROCURONIUM BROMIDE 50 MG/5ML IV SOLN
INTRAVENOUS | Status: AC
  Filled 2016-04-24: qty 1

## 2016-04-24 MED ORDER — BUPIVACAINE HCL (PF) 0.5 % IJ SOLN
INTRAMUSCULAR | Status: DC | PRN
Start: 2016-04-24 — End: 2016-04-24
  Administered 2016-04-24: 9 mL

## 2016-04-24 MED ORDER — ONDANSETRON HCL 4 MG/2ML IJ SOLN: 4.0000 mg | Freq: Once | INTRAMUSCULAR | Status: DC | PRN

## 2016-04-24 MED ORDER — LIDOCAINE HCL 1 % IJ SOLN
INTRAMUSCULAR | Status: DC | PRN
  Administered 2016-04-24: 25 mg via INTRADERMAL

## 2016-04-24 MED ORDER — LIDOCAINE HCL (PF) 1 % IJ SOLN
INTRAMUSCULAR | Status: AC
  Filled 2016-04-24: qty 5

## 2016-04-24 MED ORDER — POVIDONE-IODINE 10 % OINT PACKET
TOPICAL_OINTMENT | CUTANEOUS | Status: DC | PRN
  Administered 2016-04-24: 1 via TOPICAL

## 2016-04-24 MED ORDER — HEMOSTATIC AGENTS (NO CHARGE) OPTIME
TOPICAL | Status: DC | PRN
Start: 2016-04-24 — End: 2016-04-24
  Administered 2016-04-24: 1 via TOPICAL

## 2016-04-24 MED ORDER — MIDAZOLAM HCL 2 MG/2ML IJ SOLN
1.0000 mg | INTRAMUSCULAR | Status: DC | PRN
Start: 2016-04-24 — End: 2016-04-24
  Administered 2016-04-24: 2 mg via INTRAVENOUS

## 2016-04-24 MED ORDER — MIDAZOLAM HCL 5 MG/5ML IJ SOLN
INTRAMUSCULAR | Status: DC | PRN
  Administered 2016-04-24: 2 mg via INTRAVENOUS

## 2016-04-24 MED ORDER — NEOSTIGMINE METHYLSULFATE 10 MG/10ML IV SOLN
INTRAVENOUS | Status: DC | PRN
  Administered 2016-04-24 (×2): 2 mg via INTRAVENOUS

## 2016-04-24 MED ORDER — KETOROLAC TROMETHAMINE 30 MG/ML IJ SOLN
30.0000 mg | Freq: Once | INTRAMUSCULAR | Status: AC
  Administered 2016-04-24: 30 mg via INTRAVENOUS
  Filled 2016-04-24: qty 1

## 2016-04-24 MED ORDER — CHLORHEXIDINE GLUCONATE CLOTH 2 % EX PADS: 6.0000 | MEDICATED_PAD | Freq: Once | CUTANEOUS | Status: DC

## 2016-04-24 MED ORDER — FENTANYL CITRATE (PF) 250 MCG/5ML IJ SOLN
INTRAMUSCULAR | Status: AC
  Filled 2016-04-24: qty 5

## 2016-04-24 MED ORDER — MIDAZOLAM HCL 2 MG/2ML IJ SOLN
INTRAMUSCULAR | Status: AC
  Filled 2016-04-24: qty 2

## 2016-04-24 MED ORDER — ONDANSETRON HCL 4 MG/2ML IJ SOLN
4.0000 mg | Freq: Once | INTRAMUSCULAR | Status: AC
  Administered 2016-04-24: 4 mg via INTRAVENOUS

## 2016-04-24 MED ORDER — ROCURONIUM BROMIDE 100 MG/10ML IV SOLN
INTRAVENOUS | Status: DC | PRN
  Administered 2016-04-24: 30 mg via INTRAVENOUS
  Administered 2016-04-24: 5 mg via INTRAVENOUS

## 2016-04-24 MED ORDER — POVIDONE-IODINE 10 % EX OINT
TOPICAL_OINTMENT | CUTANEOUS | Status: AC
  Filled 2016-04-24: qty 1

## 2016-04-24 MED ORDER — GLYCOPYRROLATE 0.2 MG/ML IJ SOLN
INTRAMUSCULAR | Status: AC
  Filled 2016-04-24: qty 3

## 2016-04-24 MED ORDER — PROPOFOL 10 MG/ML IV BOLUS
INTRAVENOUS | Status: AC
  Filled 2016-04-24: qty 20

## 2016-04-24 MED ORDER — LACTATED RINGERS IV SOLN
INTRAVENOUS | Status: DC
Start: 2016-04-24 — End: 2016-04-24
  Administered 2016-04-24 (×2): via INTRAVENOUS

## 2016-04-24 MED ORDER — OXYCODONE-ACETAMINOPHEN 7.5-325 MG PO TABS: 1.0000 | ORAL_TABLET | ORAL | Status: DC | PRN

## 2016-04-24 MED ORDER — FENTANYL CITRATE (PF) 100 MCG/2ML IJ SOLN
INTRAMUSCULAR | Status: DC | PRN
  Administered 2016-04-24: 25 ug via INTRAVENOUS
  Administered 2016-04-24: 100 ug via INTRAVENOUS

## 2016-04-24 MED ORDER — SODIUM CHLORIDE 0.9 % IR SOLN
Status: DC | PRN
Start: 2016-04-24 — End: 2016-04-24
  Administered 2016-04-24: 3000 mL
  Administered 2016-04-24: 1000 mL

## 2016-04-24 MED ORDER — PROPOFOL 10 MG/ML IV BOLUS
INTRAVENOUS | Status: DC | PRN
  Administered 2016-04-24: 130 mg via INTRAVENOUS

## 2016-04-24 SURGICAL SUPPLY — 50 items
APL SRG 38 LTWT LNG FL B (MISCELLANEOUS) ×1
APPLICATOR ARISTA FLEXITIP XL (MISCELLANEOUS) ×2 IMPLANT
APPLIER CLIP LAPSCP 10X32 DD (CLIP) ×3 IMPLANT
BAG HAMPER (MISCELLANEOUS) ×3 IMPLANT
BAG SPEC RTRVL LRG 6X4 10 (ENDOMECHANICALS) ×1
CHLORAPREP W/TINT 26ML (MISCELLANEOUS) ×3 IMPLANT
CLOTH BEACON ORANGE TIMEOUT ST (SAFETY) ×3 IMPLANT
COVER LIGHT HANDLE STERIS (MISCELLANEOUS) ×6 IMPLANT
DECANTER SPIKE VIAL GLASS SM (MISCELLANEOUS) ×3 IMPLANT
ELECT REM PT RETURN 9FT ADLT (ELECTROSURGICAL) ×3
ELECTRODE REM PT RTRN 9FT ADLT (ELECTROSURGICAL) ×1 IMPLANT
FILTER SMOKE EVAC LAPAROSHD (FILTER) ×3 IMPLANT
FORMALIN 10 PREFIL 120ML (MISCELLANEOUS) ×3 IMPLANT
GLOVE BIOGEL PI IND STRL 6.5 (GLOVE) IMPLANT
GLOVE BIOGEL PI IND STRL 7.0 (GLOVE) ×1 IMPLANT
GLOVE BIOGEL PI INDICATOR 6.5 (GLOVE) ×2
GLOVE BIOGEL PI INDICATOR 7.0 (GLOVE) ×2
GLOVE ECLIPSE 6.5 STRL STRAW (GLOVE) ×2 IMPLANT
GLOVE ECLIPSE 7.0 STRL STRAW (GLOVE) ×4 IMPLANT
GLOVE EXAM NITRILE MD LF STRL (GLOVE) ×2 IMPLANT
GLOVE SURG SS PI 7.5 STRL IVOR (GLOVE) ×3 IMPLANT
GOWN STRL REUS W/ TWL XL LVL3 (GOWN DISPOSABLE) ×1 IMPLANT
GOWN STRL REUS W/TWL LRG LVL3 (GOWN DISPOSABLE) ×6 IMPLANT
GOWN STRL REUS W/TWL XL LVL3 (GOWN DISPOSABLE) ×3
HEMOSTAT ARISTA ABSORB 3G PWDR (MISCELLANEOUS) ×2 IMPLANT
HEMOSTAT SNOW SURGICEL 2X4 (HEMOSTASIS) ×3 IMPLANT
INST SET LAPROSCOPIC AP (KITS) ×3 IMPLANT
IV NS IRRIG 3000ML ARTHROMATIC (IV SOLUTION) ×4 IMPLANT
KIT ROOM TURNOVER APOR (KITS) ×3 IMPLANT
MANIFOLD NEPTUNE II (INSTRUMENTS) ×3 IMPLANT
NDL INSUFFLATION 14GA 120MM (NEEDLE) ×1 IMPLANT
NEEDLE INSUFFLATION 14GA 120MM (NEEDLE) ×3 IMPLANT
NS IRRIG 1000ML POUR BTL (IV SOLUTION) ×3 IMPLANT
PACK LAP CHOLE LZT030E (CUSTOM PROCEDURE TRAY) ×3 IMPLANT
PAD ARMBOARD 7.5X6 YLW CONV (MISCELLANEOUS) ×3 IMPLANT
POUCH SPECIMEN RETRIEVAL 10MM (ENDOMECHANICALS) ×3 IMPLANT
SET BASIN LINEN APH (SET/KITS/TRAYS/PACK) ×3 IMPLANT
SET TUBE IRRIG SUCTION NO TIP (IRRIGATION / IRRIGATOR) ×4 IMPLANT
SLEEVE ENDOPATH XCEL 5M (ENDOMECHANICALS) ×3 IMPLANT
SPONGE GAUZE 2X2 8PLY STER LF (GAUZE/BANDAGES/DRESSINGS) ×4
SPONGE GAUZE 2X2 8PLY STRL LF (GAUZE/BANDAGES/DRESSINGS) ×8 IMPLANT
STAPLER VISISTAT (STAPLE) ×3 IMPLANT
SUT VICRYL 0 UR6 27IN ABS (SUTURE) ×5 IMPLANT
TAPE CLOTH SURG 4X10 WHT LF (GAUZE/BANDAGES/DRESSINGS) ×2 IMPLANT
TROCAR ENDO BLADELESS 11MM (ENDOMECHANICALS) ×3 IMPLANT
TROCAR XCEL NON-BLD 5MMX100MML (ENDOMECHANICALS) ×3 IMPLANT
TROCAR XCEL UNIV SLVE 11M 100M (ENDOMECHANICALS) ×3 IMPLANT
TUBING INSUFFLATION (TUBING) ×3 IMPLANT
WARMER LAPAROSCOPE (MISCELLANEOUS) ×3 IMPLANT
YANKAUER SUCT 12FT TUBE ARGYLE (SUCTIONS) ×3 IMPLANT

## 2016-04-24 NOTE — Progress Notes (Signed)
Discussed with Dr. Arnoldo Morale, patient for laparoscopic cholecystectomy today, he will be primary, no need to be further seen by hospitalist service. Phillips Climes MD

## 2016-04-24 NOTE — Op Note (Signed)
Patient:  Claire [name]  DOB:  07/15/1956  MRN:  [id number]   Preop Diagnosis:  Acute cholecystitis, cholelithiasis  Postop Diagnosis:  Same, hydrops of gallbladder  Procedure:  Laparoscopic cholecystectomy  Surgeon:  Aviva Signs, M.D.  Assistant: Tama High, M.D.  Anes:  Gen. endotracheal  Indications:  Patient is a 60 year old white female who presents with an acute onset of right upper quadrant abdominal pain. She was found to have acute cholecystitis with cholelithiasis. She now presents for laparoscopic cholecystectomy. The risks and benefits of the procedure including bleeding, infection, hepatobiliary injury, and the possibility of an open procedure were fully explained to the patient, who gave informed consent.  Procedure note:  The patient was placed the supine position. After induction of general endotracheal anesthesia, the abdomen was prepped and draped using the usual sterile technique with DuraPrep. Surgical site confirmation was performed.  A supraumbilical incision was made down to the fascia. A Veress needle was introduced into the abdominal cavity and confirmation of placement was done using the saline drop test. The abdomen was then insufflated to 16 mmHg pressure. An 11 mm trocar was introduced into the abdominal cavity under direct visualization without difficulty. The patient was placed in reverse Trendelenburg position and an additional 11 mm trocar was placed the epigastric region and 5 mm trochars were placed the right upper quadrant and right flank regions. Liver was inspected and noted to be within normal limits. The gallbladder was significantly distended with a thickened gallbladder wall. An incision was made in the dome of the gallbladder in order to decompress it. Hydrops of the gallbladder was found. The gallbladder was then retracted in a dynamic fashion in order to provide a critical view of the triangle of [place]. The cystic duct was first identified. Its  juncture to the infundibulum was fully identified. Endoclips were placed proximally and distally on the cystic duct, and the cystic duct was divided. This was likewise done to the cystic artery. The gallbladder was freed away from the gallbladder fossa using Bovie electrocautery. The gallbladder was delivered to the epigastric trocar site using an Endo Catch bag. The gallbladder fossa was inspected and a bleeding was controlled using Bovie electrocautery. Arista and Surgicel were both instilled into the gallbladder fossa. All fluid and air were then evacuated from the abdominal cavity prior to removal of the trochars.  All wounds were irrigated with normal saline. All wounds were injected with 0.5% Sensorcaine. The supraumbilical fascia as well as epigastric fascia were reapproximated using 0 Vicryl interrupted sutures. All skin incisions were closed using staples. Betadine ointment and dry sterile dressings were applied.  All tape and needle counts were correct at the end of the procedure. Patient was extubated in the operating room and transferred to PACU in stable condition.  Complications:  None  EBL:  Minimal  Specimen:  Gallbladder

## 2016-04-24 NOTE — Anesthesia Postprocedure Evaluation (Signed)
Anesthesia Post Note  Patient: Claire [name]  Procedure(s) Performed: Procedure(s) (LRB): LAPAROSCOPIC CHOLECYSTECTOMY (N/A)  Patient location during evaluation: PACU Anesthesia Type: General Level of consciousness: awake and alert Pain management: pain level controlled Vital Signs Assessment: post-procedure vital signs reviewed and stable Respiratory status: spontaneous breathing Cardiovascular status: blood pressure returned to baseline Postop Assessment: no signs of nausea or vomiting Anesthetic complications: no    Last Vitals:  Filed Vitals:   04/24/16 1125 04/24/16 1254  BP: 129/72   Pulse:    Temp:  37.3 C  Resp: 24 15    Last Pain:  Filed Vitals:   04/24/16 1304  PainSc: 0-No pain                 Latesha Chesney

## 2016-04-24 NOTE — Anesthesia Preprocedure Evaluation (Signed)
Anesthesia Evaluation  Patient identified by MRN, date of birth, ID band Patient awake    Reviewed: Allergy & Precautions, NPO status , Patient's Chart, lab work & pertinent test results  Airway Mallampati: I  TM Distance: >3 FB     Dental  (+) Teeth Intact, Dental Advisory Given   Pulmonary neg pulmonary ROS,  breath sounds clear to auscultation        Cardiovascular negative cardio ROS  Rhythm:Regular Rate:Normal     Neuro/Psych    GI/Hepatic negative GI ROS,   Endo/Other    Renal/GU      Musculoskeletal   Abdominal   Peds  Hematology   Anesthesia Other Findings   Reproductive/Obstetrics                             Anesthesia Physical Anesthesia Plan  ASA: I  Anesthesia Plan: General   Post-op Pain Management:    Induction: Intravenous  Airway Management Planned: Oral ETT  Additional Equipment:   Intra-op Plan:   Post-operative Plan: Extubation in OR  Informed Consent: I have reviewed the patients History and Physical, chart, labs and discussed the procedure including the risks, benefits and alternatives for the proposed anesthesia with the patient or authorized representative who has indicated his/her understanding and acceptance.     Plan Discussed with:   Anesthesia Plan Comments:         Anesthesia Quick Evaluation  

## 2016-04-24 NOTE — Discharge Instructions (Signed)
Laparoscopic Cholecystectomy, Care After °Refer to this sheet in the next few weeks. These instructions provide you with information about caring for yourself after your procedure. Your health care provider may also give you more specific instructions. Your treatment has been planned according to current medical practices, but problems sometimes occur. Call your health care provider if you have any problems or questions after your procedure. °WHAT TO EXPECT AFTER THE PROCEDURE °After your procedure, it is common to have: °· Pain at your incision sites. You will be given pain medicines to control your pain. °· Mild nausea or vomiting. This should improve after the first 24 hours. °· Bloating and possible shoulder pain from the gas that was used during the procedure. This will improve after the first 24 hours. °HOME CARE INSTRUCTIONS °Incision Care °· Follow instructions from your health care provider about how to take care of your incisions. Make sure you: °¨ Wash your hands with soap and water before you change your bandage (dressing). If soap and water are not available, use hand sanitizer. °¨ Change your dressing as told by your health care provider. °¨ Leave stitches (sutures), skin glue, or adhesive strips in place. These skin closures may need to be in place for 2 weeks or longer. If adhesive strip edges start to loosen and curl up, you may trim the loose edges. Do not remove adhesive strips completely unless your health care provider tells you to do that. °· Do not take baths, swim, or use a hot tub until your health care provider approves. Ask your health care provider if you can take showers. You may only be allowed to take sponge baths for bathing. °General Instructions °· Take over-the-counter and prescription medicines only as told by your health care provider. °· Do not drive or operate heavy machinery while taking prescription pain medicine. °· Return to your normal diet as told by your health care  provider. °· Do not lift anything that is heavier than 10 lb (4.5 kg). °· Do not play contact sports for one week or until your health care provider approves. °SEEK MEDICAL CARE IF:  °· You have redness, swelling, or pain at the site of your incision. °· You have fluid, blood, or pus coming from your incision. °· You notice a bad smell coming from your incision area. °· Your surgical incisions break open. °· You have a fever. °SEEK IMMEDIATE MEDICAL CARE IF: °· You develop a rash. °· You have difficulty breathing. °· You have chest pain. °· You have increasing pain in your shoulders (shoulder strap areas). °· You faint or have dizzy episodes while you are standing. °· You have severe pain in your abdomen. °· You have nausea or vomiting that lasts for more than one day. °  °This information is not intended to replace advice given to you by your health care provider. Make sure you discuss any questions you have with your health care provider. °  °Document Released: 10/26/2005 Document Revised: 07/17/2015 Document Reviewed: 06/07/2013 °Elsevier Interactive Patient Education ©2016 [employer] ° °

## 2016-04-24 NOTE — Transfer of Care (Signed)
Immediate Anesthesia Transfer of Care Note  Patient: Claire [name]  Procedure(s) Performed: Procedure(s): LAPAROSCOPIC CHOLECYSTECTOMY (N/A)  Patient Location: PACU  Anesthesia Type:General  Level of Consciousness: awake and patient cooperative  Airway & Oxygen Therapy: Patient Spontanous Breathing and Patient connected to face mask oxygen  Post-op Assessment: Report given to RN, Post -op Vital signs reviewed and stable and Patient moving all extremities  Post vital signs: Reviewed and stable  Last Vitals:  Filed Vitals:   04/24/16 1120 04/24/16 1125  BP: 131/70 129/72  Pulse:    Temp:    Resp: 20 24    Last Pain:  Filed Vitals:   04/24/16 1127  PainSc: 2       Patients Stated Pain Goal: 7 ([id number])  Complications: No apparent anesthesia complications

## 2016-04-24 NOTE — Anesthesia Procedure Notes (Signed)
Procedure Name: Intubation Date/Time: 04/24/2016 11:40 AM Performed by: Charmaine Downs Pre-anesthesia Checklist: Emergency Drugs available, Patient identified, Suction available and Patient being monitored Patient Re-evaluated:Patient Re-evaluated prior to inductionOxygen Delivery Method: Circle system utilized Preoxygenation: Pre-oxygenation with 100% oxygen Intubation Type: IV induction Ventilation: Mask ventilation without difficulty Laryngoscope Size: Mac and 3 Grade View: Grade I Tube size: 7.0 mm Number of attempts: 1 Airway Equipment and Method: Stylet Placement Confirmation: ETT inserted through vocal cords under direct vision,  positive ETCO2 and breath sounds checked- equal and bilateral Secured at: 22 cm Tube secured with: Tape Dental Injury: Teeth and Oropharynx as per pre-operative assessment

## 2016-04-24 NOTE — Consult Note (Signed)
Reason for Consult: Cholecystitis, cholelithiasis Referring Physician: Dr. Mickel Baas Claire [name] is an 60 y.o. female.  HPI: Patient is a 60 year old white female who was in her usual state of good health when she presented emergency room with worsening episode right upper quadrant abdominal pain. She was seen by her primary care physician earlier that day who referred her to the hospital for further workup. She states the episode started the day earlier. She was experiencing nausea. No fever or chills were noted. She was found in the emergency room to have cholecystitis with cholelithiasis by ultrasound. She states she may have had a previous episode. She was admitted to the hospital for further evaluation treatment. She was started on IV antibiotics. This morning, her abdominal pain has eased with pain medication.  History reviewed. No pertinent past medical history.  Past Surgical History  Procedure Laterality Date  . Colonoscopy  March 2010    normal repeat 10 years  . Cesarean section      Family History  Problem Relation Age of Onset  . Hyperlipidemia Father   . Cancer Maternal Grandmother     breast  . Cancer Paternal Grandfather     colon    Social History:  reports that she has never smoked. She does not have any smokeless tobacco history on file. She reports that she does not drink alcohol or use illicit drugs.  Allergies: No Known Allergies  Medications:  Prior to Admission:  Prescriptions prior to admission  Medication Sig Dispense Refill Last Dose  . fish oil-omega-3 fatty acids 1000 MG capsule Take 1 g by mouth daily.   04/22/2016 at Unknown time  . GLUCOSAMINE PO Take 1 tablet by mouth 2 (two) times daily.    04/22/2016 at Unknown time  . Multiple Vitamin (MULTIVITAMIN) tablet Take 1 tablet by mouth daily.   04/22/2016 at Unknown time   Scheduled: . sodium chloride   Intravenous STAT  . ciprofloxacin  400 mg Intravenous Q12H  . metronidazole  500 mg  Intravenous Q8H    Results for orders placed or performed during the hospital encounter of 04/23/16 (from the past 48 hour(s))  CBC with Differential     Status: Abnormal   Collection Time: 04/23/16  3:47 PM  Result Value Ref Range   WBC 11.3 (H) 4.0 - 10.5 K/uL   RBC 4.74 3.87 - 5.11 MIL/uL   Hemoglobin 13.7 12.0 - 15.0 g/dL   HCT 41.7 36.0 - 46.0 %   MCV 88.0 78.0 - 100.0 fL   MCH 28.9 26.0 - 34.0 pg   MCHC 32.9 30.0 - 36.0 g/dL   RDW 13.2 11.5 - 15.5 %   Platelets 226 150 - 400 K/uL   Neutrophils Relative % 91 %   Neutro Abs 10.2 (H) 1.7 - 7.7 K/uL   Lymphocytes Relative 7 %   Lymphs Abs 0.8 0.7 - 4.0 K/uL   Monocytes Relative 2 %   Monocytes Absolute 0.2 0.1 - 1.0 K/uL   Eosinophils Relative 0 %   Eosinophils Absolute 0.0 0.0 - 0.7 K/uL   Basophils Relative 0 %   Basophils Absolute 0.0 0.0 - 0.1 K/uL  Comprehensive metabolic panel     Status: Abnormal   Collection Time: 04/23/16  3:47 PM  Result Value Ref Range   Sodium 134 (L) 135 - 145 mmol/L   Potassium 3.7 3.5 - 5.1 mmol/L   Chloride 98 (L) 101 - 111 mmol/L   CO2 27 22 - 32 mmol/L  Glucose, Bld 117 (H) 65 - 99 mg/dL   BUN 12 6 - 20 mg/dL   Creatinine, Ser 0.56 0.44 - 1.00 mg/dL   Calcium 9.1 8.9 - 10.3 mg/dL   Total Protein 7.6 6.5 - 8.1 g/dL   Albumin 4.4 3.5 - 5.0 g/dL   AST 28 15 - 41 U/L   ALT 43 14 - 54 U/L   Alkaline Phosphatase 43 38 - 126 U/L   Total Bilirubin 0.7 0.3 - 1.2 mg/dL   GFR calc non Af Amer >60 >60 mL/min   GFR calc Af Amer >60 >60 mL/min    Comment: (NOTE) The eGFR has been calculated using the CKD EPI equation. This calculation has not been validated in all clinical situations. eGFR's persistently <60 mL/min signify possible Chronic Kidney Disease.    Anion gap 9 5 - 15  Lipase, blood     Status: None   Collection Time: 04/23/16  3:47 PM  Result Value Ref Range   Lipase 24 11 - 51 U/L  Urinalysis, Routine w reflex microscopic     Status: Abnormal   Collection Time: 04/23/16  4:45  PM  Result Value Ref Range   Color, Urine YELLOW YELLOW   APPearance CLEAR CLEAR   Specific Gravity, Urine >1.030 (H) 1.005 - 1.030   pH 6.0 5.0 - 8.0   Glucose, UA NEGATIVE NEGATIVE mg/dL   Hgb urine dipstick SMALL (A) NEGATIVE   Bilirubin Urine NEGATIVE NEGATIVE   Ketones, ur 40 (A) NEGATIVE mg/dL   Protein, ur 100 (A) NEGATIVE mg/dL   Nitrite NEGATIVE NEGATIVE   Leukocytes, UA NEGATIVE NEGATIVE  Urine microscopic-add on     Status: Abnormal   Collection Time: 04/23/16  4:45 PM  Result Value Ref Range   Squamous Epithelial / LPF 0-5 (A) NONE SEEN   WBC, UA 0-5 0 - 5 WBC/hpf   RBC / HPF 0-5 0 - 5 RBC/hpf   Bacteria, UA FEW (A) NONE SEEN   Casts HYALINE CASTS (A) NEGATIVE  Basic metabolic panel     Status: Abnormal   Collection Time: 04/24/16  5:25 AM  Result Value Ref Range   Sodium 138 135 - 145 mmol/L   Potassium 3.8 3.5 - 5.1 mmol/L   Chloride 103 101 - 111 mmol/L   CO2 28 22 - 32 mmol/L   Glucose, Bld 122 (H) 65 - 99 mg/dL   BUN 9 6 - 20 mg/dL   Creatinine, Ser 0.47 0.44 - 1.00 mg/dL   Calcium 8.2 (L) 8.9 - 10.3 mg/dL   GFR calc non Af Amer >60 >60 mL/min   GFR calc Af Amer >60 >60 mL/min    Comment: (NOTE) The eGFR has been calculated using the CKD EPI equation. This calculation has not been validated in all clinical situations. eGFR's persistently <60 mL/min signify possible Chronic Kidney Disease.    Anion gap 7 5 - 15  CBC     Status: Abnormal   Collection Time: 04/24/16  5:25 AM  Result Value Ref Range   WBC 11.2 (H) 4.0 - 10.5 K/uL   RBC 4.30 3.87 - 5.11 MIL/uL   Hemoglobin 12.7 12.0 - 15.0 g/dL   HCT 38.5 36.0 - 46.0 %   MCV 89.5 78.0 - 100.0 fL   MCH 29.5 26.0 - 34.0 pg   MCHC 33.0 30.0 - 36.0 g/dL   RDW 13.4 11.5 - 15.5 %   Platelets 203 150 - 400 K/uL    Dg Chest 2 View  04/23/2016  CLINICAL DATA:  Severe right upper quadrant pain EXAM: CHEST  2 VIEW COMPARISON:  None. FINDINGS: The heart size and mediastinal contours are within normal limits.  Both lungs are clear. The visualized skeletal structures are unremarkable. IMPRESSION: No active cardiopulmonary disease. Electronically Signed   By: Inez Catalina M.D.   On: 04/23/2016 17:15   US Abdomen Complete  04/23/2016  CLINICAL DATA:  RIGHT upper quadrant abdominal pain for 24 hours, nausea, vomiting EXAM: ABDOMEN ULTRASOUND COMPLETE COMPARISON:  None FINDINGS: Gallbladder: 27 mm diameter non mobile calculus at lower gallbladder segment/neck. Associated gallbladder wall thickening, pericholecystic fluid and sonographic Murphy sign. Small amount of sludge within gallbladder. Findings consistent with acute cholecystitis. Common bile duct: Diameter: Not adequately visualized due to shadowing from gallstone at lower gallbladder segment/neck. Liver: Normal parenchymal echogenicity. Minimal intrahepatic biliary dilatation. No focal hepatic mass. Hepatopetal portal venous flow. IVC: Normal appearance Pancreas: Normal appearance Spleen: Normal appearance, 6.7 cm length Right Kidney: Length: 11.3 cm. Normal morphology without mass or hydronephrosis. Left Kidney: Length: 11.8 cm. Normal morphology without mass or hydronephrosis. Abdominal aorta: Normal caliber Other findings: No free fluid IMPRESSION: Cholelithiasis with gallbladder sludge, gallbladder wall thickening, pericholecystic fluid and sonographic Murphy sign consistent with acute cholecystitis. Suboptimal visualization of extrahepatic biliary tree/CBD though mild intrahepatic biliary dilatation is identified; recommend correlation with LFTs. Electronically Signed   By: Lavonia Dana M.D.   On: 04/23/2016 17:13    ROS:  Pertinent items noted in HPI and remainder of comprehensive ROS otherwise negative.  Blood pressure 129/59, pulse 97, temperature 98 F (36.7 C), temperature source Oral, resp. rate 20, height _0  (1.575 m), weight 58.968 kg (130 lb), SpO2 97 %. Physical Exam: Well-developed well-nourished white female in no acute distress. Head  examination reveals normocephalic, atraumatic Eyes are within normal limits, no scleral icterus noted. Neck is supple without lymphadenopathy or carotid bruits. No JVD noted. Lungs clear to auscultation with equal breath sounds bilaterally. Heart examination reveals a regular rate and rhythm without S3, S4, murmurs. Abdomen is soft, nondistended. No rigidity noted. Mild discomfort to palpation noted in the right upper quadrant. No hepatosplenomegaly or masses are noted.  Assessment/Plan: Impression: Acute cholecystitis, cholelithiasis Plan: Patient be taken to the operating room today for laparoscopic cholecystectomy. The risks and benefits of the procedure including bleeding, infection, hepatobiliary injury, the possibility of an open procedure were fully explained to the patient, who gave informed consent.  Deshun Sedivy A 04/24/2016, 7:48 AM

## 2016-04-25 NOTE — Progress Notes (Signed)
Patient discharged with instructions, prescription, and care notes.  Verbalized understanding via teach back.  IV was removed and the site was WNL. Patient voiced no further complaints or concerns at the time of discharge.  Appointments scheduled per instructions.  Patient left the floor via w/c with staff and family in stable condition. 

## 2016-04-27 ENCOUNTER — Encounter (HOSPITAL_COMMUNITY): Payer: Self-pay | Admitting: General Surgery

## 2016-04-27 MED ORDER — HEMOSTATIC AGENTS (NO CHARGE) OPTIME
TOPICAL | Status: DC | PRN
  Administered 2016-04-24: 1 via TOPICAL

## 2016-04-27 NOTE — Discharge Summary (Signed)
Physician Discharge Summary  Patient ID: Claire [name] MRN: [id number] DOB/AGE: 59/06/1956 59 y.o.  Admit date: 04/23/2016 Discharge date: 04/24/16 Admission Diagnoses:Cholecystitis, cholelithiasis  Discharge Diagnoses: Same Active Problems:   Acute cholecystitis   Discharged Condition: good  Hospital Course: Patient is a 60 year old white female who presented emergency room with worsening right upper quadrant abdominal pain. Workup revealed biliary colic, cholecystitis secondary to cholelithiasis. She was brought into the hospital for further management of her pain and nausea. She subsequently underwent a laparoscopic cholecystectomy on 04/24/2016. She tolerated the procedure well. Postoperative course was unremarkable. Her diet was advanced without difficulty. She was discharged home on 04/24/2016 in good and improving condition.  Treatments: surgery: Laparoscopic cholecystectomy on 04/24/2016  Discharge Exam: Blood pressure 119/62, pulse 62, temperature 98.8 F (37.1 C), temperature source Oral, resp. rate 20, height 5\' 2"  (1.575 m), weight 58.968 kg (130 lb), SpO2 97 %. General appearance: alert, cooperative and no distress Resp: clear to auscultation bilaterally Cardio: regular rate and rhythm, S1, S2 normal, no murmur, click, rub or gallop GI: Soft, dressings dry and intact.  Disposition: 01-Home or Self Care     Medication List    TAKE these medications        fish oil-omega-3 fatty acids 1000 MG capsule  Take 1 g by mouth daily.     GLUCOSAMINE PO  Take 1 tablet by mouth 2 (two) times daily.     multivitamin tablet  Take 1 tablet by mouth daily.     oxyCODONE-acetaminophen 7.5-325 MG tablet  Commonly known as:  PERCOCET  Take 1-2 tablets by mouth every 4 (four) hours as needed.           Follow-up Information    Follow up with Jamesetta So, MD. Schedule an appointment as soon as possible for a visit on 05/05/2016.   Specialty:  General Surgery   Contact information:   [id number] [id number] [phone]       Signed: Aviva Signs A 04/27/2016, 9:07 AM

## 2016-06-19 ENCOUNTER — Encounter: Payer: Self-pay | Admitting: Family Medicine

## 2016-06-19 ENCOUNTER — Ambulatory Visit (INDEPENDENT_AMBULATORY_CARE_PROVIDER_SITE_OTHER): Payer: No Typology Code available for payment source | Admitting: Family Medicine

## 2016-06-19 VITALS — BP 100/70 | Temp 98.1°F | Wt 127.5 lb

## 2016-06-19 DIAGNOSIS — H918X2 Other specified hearing loss, left ear: Secondary | ICD-10-CM | POA: Diagnosis not present

## 2016-06-19 DIAGNOSIS — H6122 Impacted cerumen, left ear: Secondary | ICD-10-CM

## 2016-06-19 NOTE — Progress Notes (Signed)
   Subjective:    Patient ID: Claire [name], female    DOB: 1956-11-03, 60 y.o.   MRN: [id number]  Otalgia   There is pain in the left ear. This is a new problem. The current episode started more than 1 month ago. The problem has been unchanged. There has been no fever. The pain is moderate. She has tried nothing for the symptoms. The treatment provided no relief.    She denies fevers chills congestion  Review of Systems  HENT: Positive for ear pain.        Objective:   Physical Exam  Bilateral cerumen impaction worse on the left than the right      Assessment & Plan:  Referral to ENT for removal of cerumen. Patient going on a trip in approximately 9 days out of country she would like to get this completed before this trip

## 2016-06-25 ENCOUNTER — Ambulatory Visit (INDEPENDENT_AMBULATORY_CARE_PROVIDER_SITE_OTHER): Payer: No Typology Code available for payment source | Admitting: Otolaryngology

## 2016-06-25 DIAGNOSIS — H6123 Impacted cerumen, bilateral: Secondary | ICD-10-CM

## 2016-06-25 DIAGNOSIS — H900 Conductive hearing loss, bilateral: Secondary | ICD-10-CM

## 2016-07-03 ENCOUNTER — Encounter: Payer: Self-pay | Admitting: Family Medicine

## 2017-06-24 ENCOUNTER — Ambulatory Visit (INDEPENDENT_AMBULATORY_CARE_PROVIDER_SITE_OTHER): Payer: No Typology Code available for payment source | Admitting: Otolaryngology

## 2017-06-24 DIAGNOSIS — H6983 Other specified disorders of Eustachian tube, bilateral: Secondary | ICD-10-CM

## 2018-09-02 ENCOUNTER — Ambulatory Visit (INDEPENDENT_AMBULATORY_CARE_PROVIDER_SITE_OTHER): Payer: No Typology Code available for payment source | Admitting: Family Medicine

## 2018-09-02 ENCOUNTER — Encounter: Payer: Self-pay | Admitting: Family Medicine

## 2018-09-02 VITALS — BP 110/68 | Temp 98.0°F | Ht 62.0 in | Wt 136.0 lb

## 2018-09-02 DIAGNOSIS — L02511 Cutaneous abscess of right hand: Secondary | ICD-10-CM | POA: Diagnosis not present

## 2018-09-02 DIAGNOSIS — R55 Syncope and collapse: Secondary | ICD-10-CM | POA: Diagnosis not present

## 2018-09-02 DIAGNOSIS — L03011 Cellulitis of right finger: Secondary | ICD-10-CM | POA: Diagnosis not present

## 2018-09-02 HISTORY — DX: Syncope and collapse: R55

## 2018-09-02 MED ORDER — DOXYCYCLINE HYCLATE 100 MG PO TABS
100.0000 mg | ORAL_TABLET | Freq: Two times a day (BID) | ORAL | 0 refills | Status: DC
Start: 2018-09-02 — End: 2019-10-02

## 2018-09-02 NOTE — Progress Notes (Addendum)
Subjective:    Patient ID: Claire [name], female    DOB: Jan 22, 1956, 62 y.o.   MRN: [id number]  HPI Patient is here today with complaints of middle finger on her right hand is infected.She states this started 2-3 days ago it became red and painful.She has been soaking it in warm salt water. Denies injury. Able to flex all finger joints. No purulent drainage. Reports cuticle are very dry and crack frequently. Started taking ibuprofen last night d/t finger throbbing.   She relates this area is very painful tenderness throbbing she is willing to get it drained I did discuss the procedure with her and she agreed to it  I did describe to the patient that typically we would do numbness at the site of the abscess and use a scalpel to help drain it.  She stated that she was willing to do so.  She did state to the nurse that she does get dizzy and sometimes passes out with procedures and she requested that nobody called 911 if she passes out.  Review of Systems  Constitutional: Negative for fever.  Musculoskeletal: Negative for arthralgias and joint swelling.      Objective:   Physical Exam  Constitutional: She is oriented to person, place, and time. She appears well-developed and well-nourished. No distress.  HENT:  Head: Normocephalic and atraumatic.  Pulmonary/Chest: Effort normal. No respiratory distress.  Neurological: She is alert and oriented to person, place, and time.  Skin:  Erythema and edema noted around right middle finger nail, blanched area to lateral side of fingernail, no drainage noted. Able to fully flex all joints in affected finger.   Nursing note and vitals reviewed.    Procedure note There is obvious abscess along the lateral portion of the cuticle with redness cellulitis and pus After discussion with the patient on how we would drain that she agrees 2 7 gauge needle with lidocaine without epinephrine was used to inject a small amount there are numbing the  skin Betadine swab was used Sterile technique was used 11.  Blade was used to make a small incision Pus was obtained Culture was sent  The patient was laying back for this procedure with slight incline to her head She stated she was feeling dizzy The table was laid back flat At this point we had already finished the procedure The patient then stated she felt like she was going to pass out and in fact did pass out with slowing of her breathing and becoming unresponsive-her color remained. Her legs were elevated Gentle stimulation was given to the patient The patient was breathing-patient did not have a seizure but did have slight grimace to the face. At first it was difficult to ascertain pulse, our nurse practitioner and myself were ready to initiate CPR measures.  Then it was difficult to find carotid or radial pulse.  CPR protocol was initiated and additional staff was summoned.  The patient immediately woke up after the initiation of these measures.  Ambu bag was brought out as well but I listen to her chest and her heart was beating fine the patient did not have any seizure and did not become cyanotic Initially we felt no choice but to initiate 911 during this issue  Once again, when the patient woke up from her passing out and she was calm and and reassured-once the patient woke up and it was ascertained that she was fine 911 was canceled. She was in a mild sweat all over Her  blood pressure was good at 106/70 She was monitored over the course of the next 25 minutes while her husband was coming to drive her home She did not pass out anymore The patient was able to relate to [place] completely what was going on and discuss how she was feeling She states she has had similar spells like this before when in the hospital for various procedures the last one happening after she had a colonoscopy She was told to lay low for today no driving to go home and rest eat as she felt up to it and notify us  if any problems  Assessment & Plan:  Will send culture doxycycline twice daily for the next 10 days Warning signs regarding progression of infection was discussed Warm soaks 20 minutes every 2 hours on a regular basis Call us if any problems   Severe vasovagal syncope We talked at length about how in the future if she has any significant abscess more than likely these would be drained at the hospital  As attending physician to this patient visit, this patient was seen in conjunction with the nurse practitioner.  The history,physical and treatment plan was reviewed with the nurse practitioner and pertinent findings were verified with the patient.  Also the treatment plan was reviewed with the patient while they were present. SAL

## 2018-09-06 LAB — WOUND CULTURE: Organism ID, Bacteria: NONE SEEN

## 2018-09-07 ENCOUNTER — Telehealth: Payer: Self-pay | Admitting: *Deleted

## 2018-09-07 NOTE — Telephone Encounter (Signed)
That is fine 

## 2018-09-07 NOTE — Telephone Encounter (Signed)
Called pt and told her dr Nicki Reaper would like to do a courtesy visit to recheck her finger. She states she thinks it is fine and she is not having any problem with it. Declined appt. Advised pt to follow up if she is having any issues with her finger not healing and she states she will.

## 2018-12-04 ENCOUNTER — Other Ambulatory Visit: Payer: Self-pay

## 2018-12-04 ENCOUNTER — Ambulatory Visit (HOSPITAL_COMMUNITY)
Admission: EM | Admit: 2018-12-04 | Discharge: 2018-12-04 | Disposition: A | Discharge status: Home / Self Care | Payer: No Typology Code available for payment source | Attending: Family Medicine | Admitting: Family Medicine

## 2018-12-04 ENCOUNTER — Encounter (HOSPITAL_COMMUNITY): Payer: Self-pay | Admitting: *Deleted

## 2018-12-04 DIAGNOSIS — H8390 Unspecified disease of inner ear, unspecified ear: Secondary | ICD-10-CM | POA: Diagnosis not present

## 2018-12-04 MED ORDER — ONDANSETRON HCL 4 MG/2ML IJ SOLN
4.0000 mg | Freq: Once | INTRAMUSCULAR | Status: AC
  Administered 2018-12-04: 4 mg via INTRAMUSCULAR

## 2018-12-04 MED ORDER — DIPHENHYDRAMINE HCL 25 MG PO CAPS
50.0000 mg | ORAL_CAPSULE | Freq: Once | ORAL | Status: AC
  Administered 2018-12-04: 50 mg via ORAL

## 2018-12-04 MED ORDER — DIPHENHYDRAMINE HCL 25 MG PO CAPS
ORAL_CAPSULE | ORAL | Status: AC
  Filled 2018-12-04: qty 2

## 2018-12-04 MED ORDER — ONDANSETRON HCL 4 MG/2ML IJ SOLN
INTRAMUSCULAR | Status: AC
  Filled 2018-12-04: qty 2

## 2018-12-04 NOTE — ED Triage Notes (Signed)
Reports waking up with ear and head fullness this AM. Has had congestion x couple weeks.  Has been taking Sudafed. Late this AM started with vertigo.  C/O nausea.

## 2018-12-04 NOTE — ED Provider Notes (Signed)
[place]    CSN: [id number] Arrival date & time: 12/04/18  1121     History   Chief Complaint Chief Complaint  Patient presents with  . Dizziness  . Ear Fullness    HPI Claire [name] is a 63 y.o. female.   Patient woke up with ear and head fullness this morning.  Has had some congestion for several weeks and taking Sudafed or Sudafed like medication.  Started this morning with vertigo and nausea  HPI  Past Medical History:  Diagnosis Date  . Vasovagal syncope 09/02/2018   Patient has severe vasovagal syncope with painful stimuli or sometimes with medical procedures.    Patient Active Problem List   Diagnosis Date Noted  . Vasovagal syncope 09/02/2018  . Acute cholecystitis 04/23/2016    Past Surgical History:  Procedure Laterality Date  . CESAREAN SECTION    . CHOLECYSTECTOMY N/A 04/24/2016   Procedure: LAPAROSCOPIC CHOLECYSTECTOMY;  Surgeon: Aviva Signs, MD;  Location: [facility];  Service: General;  Laterality: N/A;  . COLONOSCOPY  March 2010   normal repeat 10 years    OB History   No obstetric history on file.      Home Medications    Prior to Admission medications   Medication Sig Start Date End Date Taking? Authorizing Provider  fish oil-omega-3 fatty acids 1000 MG capsule Take 1 g by mouth daily.   Yes [provider]  GLUCOSAMINE PO Take 1 tablet by mouth 2 (two) times daily.    Yes [provider]  Multiple Vitamin (MULTIVITAMIN) tablet Take 1 tablet by mouth daily.   Yes [provider]  doxycycline (VIBRA-TABS) 100 MG tablet Take 1 tablet (100 mg total) by mouth 2 (two) times daily. Take with a snack and tall glass of water. 09/02/18   Cheyenne Adas, NP    Family History Family History  Problem Relation Age of Onset  . Hyperlipidemia Father   . Cancer Maternal Grandmother        breast  . Cancer Paternal Grandfather        colon    Social History Social History   Tobacco Use  . Smoking  status: Never Smoker  . Smokeless tobacco: Never Used  Substance Use Topics  . Alcohol use: No  . Drug use: No     Allergies   Patient has no known allergies.   Review of Systems Review of Systems  HENT: Positive for congestion.   Gastrointestinal: Positive for nausea.  Neurological: Positive for dizziness.     Physical Exam Triage Vital Signs ED Triage Vitals  Enc Vitals Group     BP 12/04/18 1217 (!) 129/57     Pulse Rate 12/04/18 1217 64     Resp 12/04/18 1217 16     Temp 12/04/18 1217 98.1 F (36.7 C)     Temp Source 12/04/18 1217 Temporal     SpO2 12/04/18 1217 99 %     Weight --      Height --      Head Circumference --      Peak Flow --      Pain Score 12/04/18 1218 0     Pain Loc --      Pain Edu? --      Excl. in [place]? --    No data found.  Updated Vital Signs BP (!) 129/57 Comment: reports normally has low BP  Pulse 64   Temp 98.1 F (36.7 C) (Temporal)   Resp 16  SpO2 99%   Visual Acuity Right Eye Distance:   Left Eye Distance:   Bilateral Distance:    Right Eye Near:   Left Eye Near:    Bilateral Near:     Physical Exam Constitutional:      Appearance: Normal appearance.  HENT:     Right Ear: Tympanic membrane normal.     Left Ear: Tympanic membrane normal.  Eyes:     Comments: Nystagmus with few beats to right  Neurological:     General: No focal deficit present.     Mental Status: She is alert and oriented to person, place, and time.     Comments: Negative Romberg and finger-to-nose      UC Treatments / Results  Labs (all labs ordered are listed, but only abnormal results are displayed) Labs Reviewed - No data to display  EKG None  Radiology No results found.  Procedures Procedures (including critical care time)  Medications Ordered in UC Medications  ondansetron (ZOFRAN) injection 4 mg (4 mg Intramuscular Given 12/04/18 1259)  diphenhydrAMINE (BENADRYL) capsule 50 mg (50 mg Oral Given 12/04/18 1335)    Initial  Impression / Assessment and Plan / UC Course  I have reviewed the triage vital signs and the nursing notes.  Pertinent labs & imaging results that were available during my care of the patient were reviewed by me and considered in my medical decision making (see chart for details).     Probable inner ear infection.  Given medication here.  And instructions regarding Epley maneuver discussed Final Clinical Impressions(s) / UC Diagnoses   Final diagnoses:  Inner ear disease, unspecified laterality   Discharge Instructions   None    ED Prescriptions    None     Controlled Substance Prescriptions [place] Controlled Substance Registry consulted? No   Wardell Honour, MD 12/04/18 [phone]

## 2019-09-11 ENCOUNTER — Other Ambulatory Visit: Payer: Self-pay | Admitting: Family Medicine

## 2019-09-11 DIAGNOSIS — Z1231 Encounter for screening mammogram for malignant neoplasm of breast: Secondary | ICD-10-CM

## 2019-09-20 ENCOUNTER — Ambulatory Visit
Admission: RE | Admit: 2019-09-20 | Discharge: 2019-09-20 | Disposition: A | Discharge status: Home / Self Care | Payer: No Typology Code available for payment source | Source: Ambulatory Visit | Attending: Family Medicine | Admitting: Family Medicine

## 2019-09-20 ENCOUNTER — Other Ambulatory Visit: Payer: Self-pay

## 2019-09-20 DIAGNOSIS — Z1231 Encounter for screening mammogram for malignant neoplasm of breast: Secondary | ICD-10-CM

## 2019-10-02 ENCOUNTER — Other Ambulatory Visit: Payer: Self-pay

## 2019-10-02 ENCOUNTER — Ambulatory Visit (INDEPENDENT_AMBULATORY_CARE_PROVIDER_SITE_OTHER): Payer: No Typology Code available for payment source | Admitting: Family Medicine

## 2019-10-02 ENCOUNTER — Encounter: Payer: Self-pay | Admitting: Family Medicine

## 2019-10-02 VITALS — BP 108/70 | Temp 97.8°F | Ht 61.5 in | Wt 137.0 lb

## 2019-10-02 DIAGNOSIS — Z131 Encounter for screening for diabetes mellitus: Secondary | ICD-10-CM | POA: Diagnosis not present

## 2019-10-02 DIAGNOSIS — Z124 Encounter for screening for malignant neoplasm of cervix: Secondary | ICD-10-CM

## 2019-10-02 DIAGNOSIS — Z13220 Encounter for screening for lipoid disorders: Secondary | ICD-10-CM | POA: Diagnosis not present

## 2019-10-02 DIAGNOSIS — Z0000 Encounter for general adult medical examination without abnormal findings: Secondary | ICD-10-CM | POA: Diagnosis not present

## 2019-10-02 MED ORDER — SHINGRIX 50 MCG/0.5ML IM SUSR: 0.5000 mL | Freq: Once | INTRAMUSCULAR | 1 refills | Status: AC

## 2019-10-02 NOTE — Patient Instructions (Signed)

## 2019-10-02 NOTE — Progress Notes (Signed)
   Subjective:    Patient ID: Claire [name], female    DOB: [birth year], 63 y.o.   MRN: [id number]  HPI  The patient comes in today for a wellness visit.  Patient exercises watch diet stays physically active.  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  Denies rectal bleeding.  A review of their health history was completed.  A review of medications was also completed.  Any needed refills; none  Eating habits: eats good  Falls/  MVA accidents in past few months: none  Regular exercise: 5 times a week  Specialist pt sees on regular basis: none  Preventative health issues were discussed.   Additional concerns: none   Review of Systems  Constitutional: Negative for activity change, appetite change and fatigue.  HENT: Negative for congestion and rhinorrhea.   Eyes: Negative for discharge.  Respiratory: Negative for cough, chest tightness and wheezing.   Cardiovascular: Negative for chest pain.  Gastrointestinal: Negative for abdominal pain, blood in stool and vomiting.  Endocrine: Negative for polyphagia.  Genitourinary: Negative for difficulty urinating and frequency.  Musculoskeletal: Negative for neck pain.  Skin: Negative for color change.  Allergic/Immunologic: Negative for environmental allergies and food allergies.  Neurological: Negative for weakness and headaches.  Psychiatric/Behavioral: Negative for agitation and behavioral problems.       Objective:   Physical Exam Constitutional:      Appearance: She is well-developed.  HENT:     Head: Normocephalic.     Right Ear: External ear normal.     Left Ear: External ear normal.  Eyes:     Pupils: Pupils are equal, round, and reactive to light.  Neck:     Musculoskeletal: Normal range of motion.     Thyroid: No thyromegaly.  Cardiovascular:     Rate and Rhythm: Normal rate and regular rhythm.     Heart sounds: Normal heart sounds. No murmur.  Pulmonary:     Effort: Pulmonary effort is normal. No respiratory  distress.     Breath sounds: Normal breath sounds. No wheezing.  Abdominal:     General: Bowel sounds are normal. There is no distension.     Palpations: Abdomen is soft. There is no mass.     Tenderness: There is no abdominal tenderness.  Musculoskeletal: Normal range of motion.        General: No tenderness.  Lymphadenopathy:     Cervical: No cervical adenopathy.  Skin:    General: Skin is warm and dry.  Neurological:     Mental Status: She is alert and oriented to person, place, and time.     Motor: No abnormal muscle tone.  Psychiatric:        Behavior: Behavior normal.     Breast exam normal bilateral Pelvic exam normal no masses Pap smear taken Cervix appears normal Bone density recommended by age 65 Shingrix recommended       Assessment & Plan:  T Adult wellness-complete.wellness physical was conducted today. Importance of diet and exercise were discussed in detail.  In addition to this a discussion regarding safety was also covered. We also reviewed over immunizations and gave recommendations regarding current immunization needed for age.  In addition to this additional areas were also touched on including: Preventative health exams needed:  Colonoscopy she is due for colonoscopy she states she will get this set up in the near future Patient defers on flu vaccine Patient will do lab work in the near future Patient was advised yearly wellness exam

## 2019-10-09 LAB — PAP IG W/ RFLX HPV ASCU

## 2019-10-30 ENCOUNTER — Ambulatory Visit: Payer: No Typology Code available for payment source

## 2020-07-07 IMAGING — MG DIGITAL SCREENING BILAT W/ CAD
4 series · 4 of 4 positions shown · non-contrast
Comparison: Previous exam(s).

CLINICAL DATA: Screening.

EXAM:
DIGITAL SCREENING BILATERAL MAMMOGRAM WITH CAD

[L MLO]
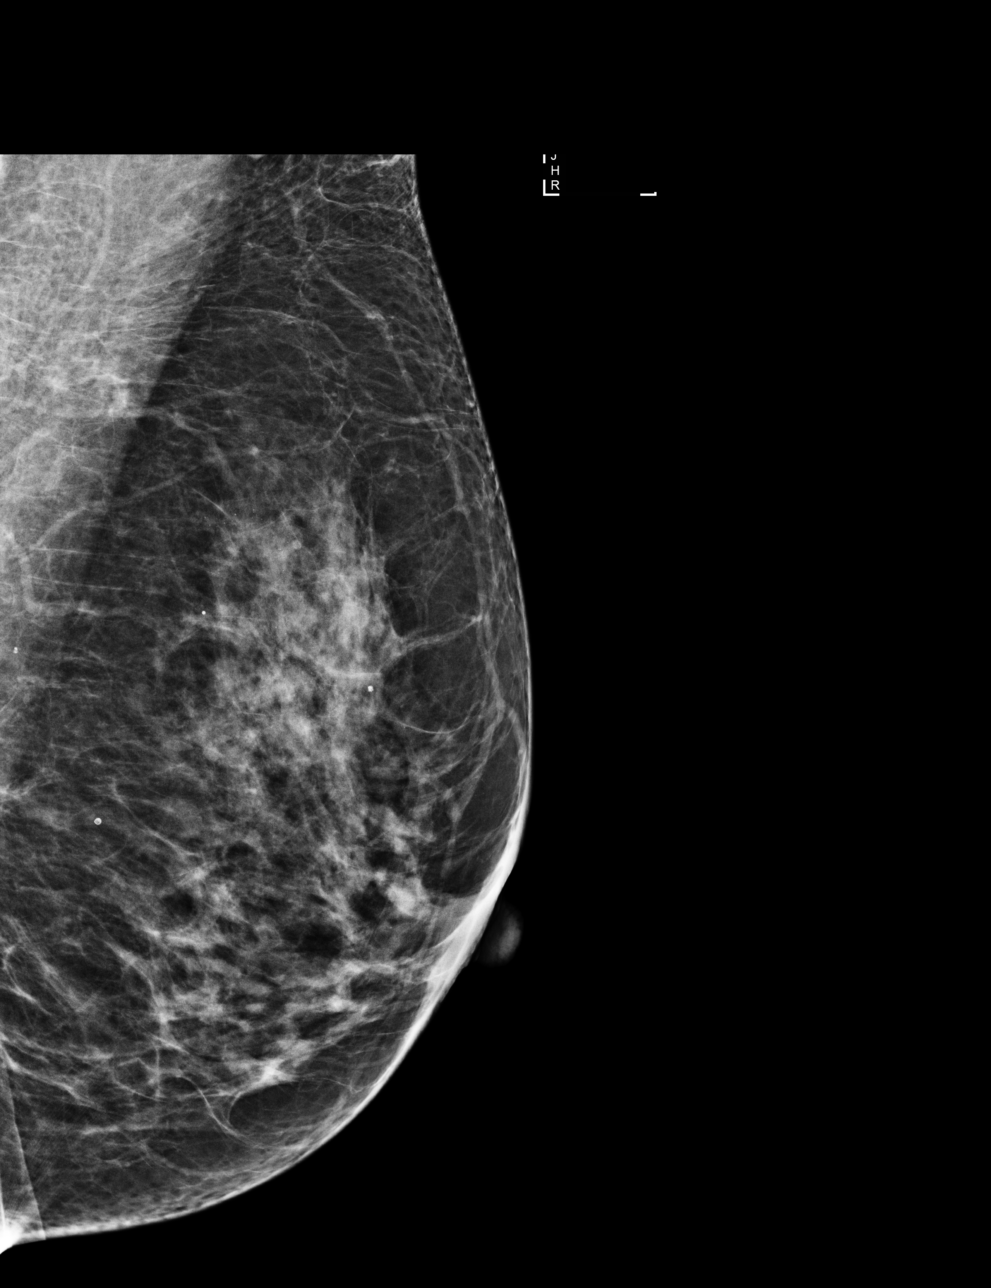

[R CC]
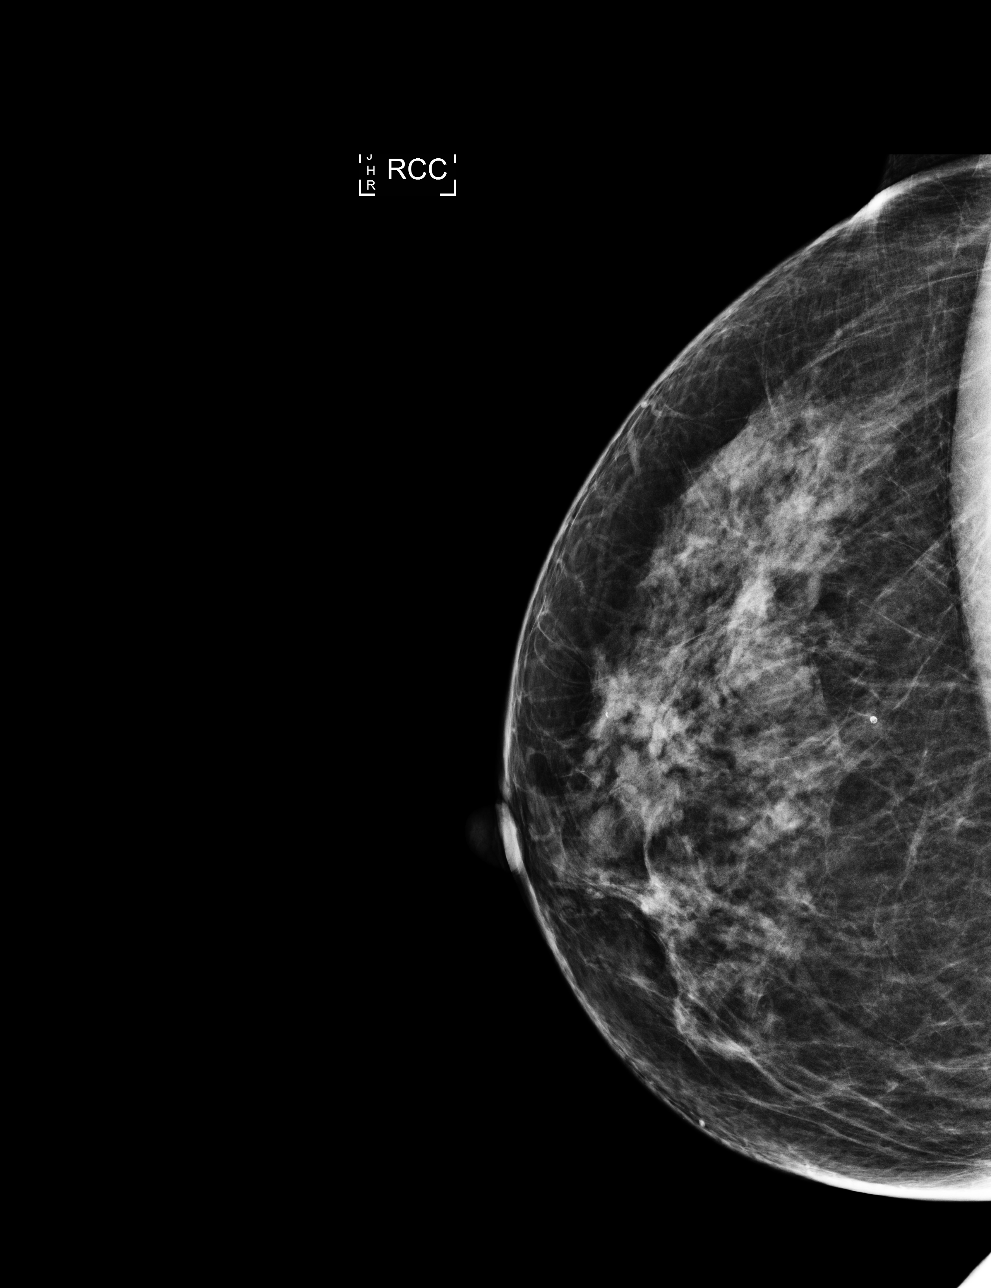

[R MLO]
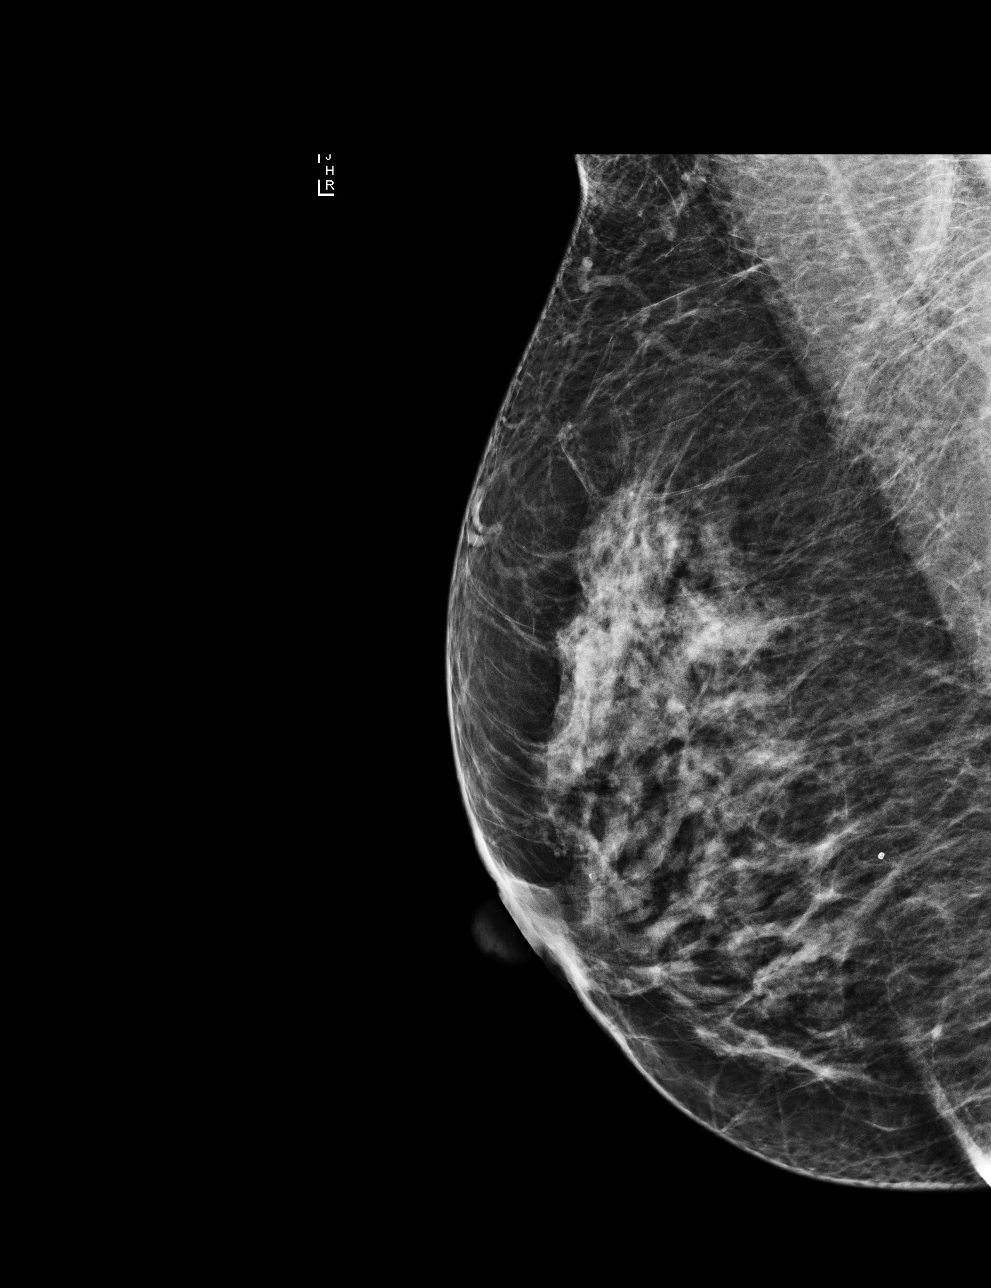

[L CC]
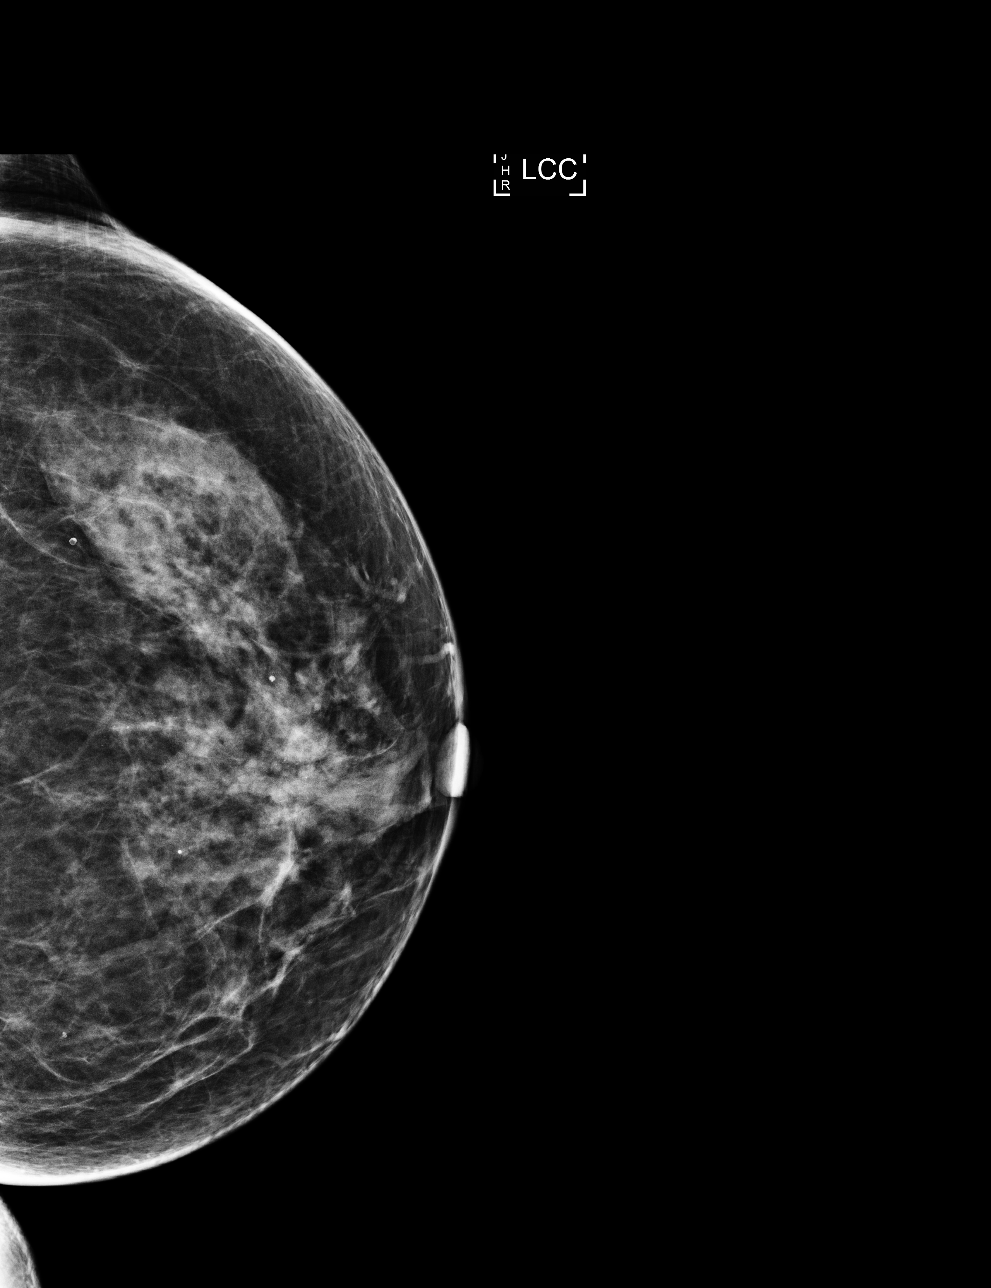

[4 of 4 positions shown; findings below may reference images not displayed]

ACR Breast Density Category c: The breast tissue is heterogeneously
dense, which may obscure small masses.
FINDINGS: There are no findings suspicious for malignancy. Images were
processed with CAD.
IMPRESSION: No mammographic evidence of malignancy. A result letter of this
screening mammogram will be mailed directly to the patient.

RECOMMENDATION:
Screening mammogram in one year. (Code:YJ-2-FEZ)

BI-RADS CATEGORY  1: Negative.

## 2020-07-08 ENCOUNTER — Telehealth: Payer: Self-pay | Admitting: Physician Assistant

## 2020-07-08 NOTE — Telephone Encounter (Signed)
See patient note. I thought elidel was an eczema cream and metronidazole was roascea?

## 2020-07-08 NOTE — Telephone Encounter (Signed)
Elidel would be $300+; pharmacist suggested metrogel as less expensive alternative. Pharmacy:[facility].

## 2020-07-08 NOTE — Telephone Encounter (Signed)
Hi.. You are correct.  Sometimes we use Elidel to decrease immune reaction. I guess the patient is aware? Could also try protopic if it is covered. Metro cream is fine too.... Which ever one you can get covered... QD-BID [id number]

## 2020-07-10 MED ORDER — METRONIDAZOLE 0.75 % EX CREA: TOPICAL_CREAM | Freq: Two times a day (BID) | CUTANEOUS | 0 refills | Status: AC

## 2020-07-10 NOTE — Telephone Encounter (Signed)
Phone call to patient to let her know we switched her medication so it will be covered and cheaper to metro cream.

## 2022-01-20 ENCOUNTER — Ambulatory Visit: Payer: No Typology Code available for payment source | Admitting: Physician Assistant

## 2022-02-24 ENCOUNTER — Encounter: Payer: Self-pay | Admitting: Physician Assistant

## 2022-02-24 ENCOUNTER — Ambulatory Visit (INDEPENDENT_AMBULATORY_CARE_PROVIDER_SITE_OTHER): Payer: Self-pay | Admitting: Physician Assistant

## 2022-02-24 DIAGNOSIS — Z1283 Encounter for screening for malignant neoplasm of skin: Secondary | ICD-10-CM

## 2022-02-24 DIAGNOSIS — D485 Neoplasm of uncertain behavior of skin: Secondary | ICD-10-CM

## 2022-02-24 DIAGNOSIS — D0339 Melanoma in situ of other parts of face: Secondary | ICD-10-CM

## 2022-02-24 NOTE — Progress Notes (Signed)
? ?  Follow-Up Visit ?  ?Subjective  ?Claire [name] is a 66 y.o. female who presents for the following: Annual Exam (Patient here today for yearly skin check no new concerns. No personal history or family history of atypical moles, melanoma or non mole skin cancer. ). ? ? ?The following portions of the chart were reviewed this encounter and updated as appropriate:  Tobacco  Allergies  Meds  Problems  Med Hx  Surg Hx  Fam Hx   ?  ? ?Objective  ?Well appearing patient in no apparent distress; mood and affect are within normal limits. ? ?A full examination was performed including scalp, head, eyes, ears, nose, lips, neck, chest, axillae, abdomen, back, buttocks, bilateral upper extremities, bilateral lower extremities, hands, feet, fingers, toes, fingernails, and toenails. All findings within normal limits unless otherwise noted below. ? ?No atypical nevi or signs of NMSC noted at the time of the visit.  ? ?Right sideburn ?Bichromic dark nested macule.  ? ? ? ? ? ? ? ?Assessment & Plan  ?Encounter for screening for malignant neoplasm of skin ? ?Yearly skin examinations ? ?Neoplasm of uncertain behavior of skin ?Right sideburn ? ?Skin / nail biopsy ?Type of biopsy: tangential   ?Informed consent: discussed and consent obtained   ?Timeout: patient name, date of birth, surgical site, and procedure verified   ?Procedure prep:  Patient was prepped and draped in usual sterile fashion (Non sterile) ?Prep type:  Chlorhexidine ?Anesthesia: the lesion was anesthetized in a standard fashion   ?Anesthetic:  1% lidocaine w/ epinephrine 1-100,000 local infiltration ?Instrument used: flexible razor blade   ?Outcome: patient tolerated procedure well   ?Post-procedure details: wound care instructions given   ? ?Specimen 1 - Surgical pathology ?Differential Diagnosis: r/o atypia ? ?Check Margins: No ? ? ? ?I, Sai Zinn, PA-C, have reviewed all documentation's for this visit.  The documentation on 02/24/22 for the exam,  diagnosis, procedures and orders are all accurate and complete. ?

## 2022-02-24 NOTE — Patient Instructions (Signed)

## 2022-03-02 ENCOUNTER — Telehealth: Payer: Self-pay | Admitting: Physician Assistant

## 2022-03-02 NOTE — Telephone Encounter (Signed)
Referral sent to Dr. Leroy Sea Merritt's office.  ?

## 2022-03-02 NOTE — Telephone Encounter (Signed)
Called patient to give her biopsy results. We discussed treatment and risks. I suggested Dr. Manley Mason from [facility]. We will facilitate the referral immediately. ?

## 2022-03-11 ENCOUNTER — Telehealth: Payer: Self-pay | Admitting: Physician Assistant

## 2022-03-11 NOTE — Telephone Encounter (Signed)
Phone call to patient to give her Dr. Thornton Park phone number and to inform her that I checked the email and according to Dr. Thornton Park office the patient's insurance will not cover the procedure so she would be a self pay patient. Patient aware and wanted to know the cost of the procedure. I informed patient that she would need to contact Dr. Thornton Park office for that information.  ?

## 2022-03-11 NOTE — Telephone Encounter (Signed)
Patient is calling to say that she was supposed to get a telephone call from Anne Fu, M.D.'s office concerning a Moh's appointment, but to date she has not received a call.  Patient says that she was supposed to call the office if she had not received the call from Dr. Thornton Park office. ?

## 2022-11-30 ENCOUNTER — Other Ambulatory Visit: Payer: Self-pay | Admitting: Family Medicine

## 2022-11-30 ENCOUNTER — Telehealth: Payer: Self-pay | Admitting: Family Medicine

## 2022-11-30 ENCOUNTER — Telehealth: Payer: Self-pay

## 2022-11-30 DIAGNOSIS — Z13220 Encounter for screening for lipoid disorders: Secondary | ICD-10-CM

## 2022-11-30 DIAGNOSIS — Z1231 Encounter for screening mammogram for malignant neoplasm of breast: Secondary | ICD-10-CM

## 2022-11-30 DIAGNOSIS — Z1329 Encounter for screening for other suspected endocrine disorder: Secondary | ICD-10-CM

## 2022-11-30 DIAGNOSIS — Z79899 Other long term (current) drug therapy: Secondary | ICD-10-CM

## 2022-11-30 DIAGNOSIS — Z13820 Encounter for screening for osteoporosis: Secondary | ICD-10-CM

## 2022-11-30 NOTE — Telephone Encounter (Signed)
Patient is scheduled for a physical on 12/18/2021 with Hoyle Sauer. She would like to have blood work prior to that appt.  CB# [phone]

## 2022-11-30 NOTE — Telephone Encounter (Signed)
Patient called and needs referral for Bone density for [facility]. Please advise.

## 2022-11-30 NOTE — Telephone Encounter (Signed)
Please go ahead with this thank you

## 2022-11-30 NOTE — Telephone Encounter (Signed)
No recent labs from our office. Please advise. Thank you

## 2022-12-01 NOTE — Telephone Encounter (Signed)
Patient notified via MyChart that the Bone Density has been ordered and she can schedule her appointment.

## 2022-12-02 ENCOUNTER — Ambulatory Visit
Admission: RE | Admit: 2022-12-02 | Discharge: 2022-12-02 | Disposition: A | Discharge status: Home / Self Care | Payer: Medicare Other | Source: Ambulatory Visit | Attending: Family Medicine | Admitting: Family Medicine

## 2022-12-02 DIAGNOSIS — Z13820 Encounter for screening for osteoporosis: Secondary | ICD-10-CM

## 2022-12-02 DIAGNOSIS — Z1231 Encounter for screening mammogram for malignant neoplasm of breast: Secondary | ICD-10-CM | POA: Diagnosis not present

## 2022-12-02 DIAGNOSIS — Z780 Asymptomatic menopausal state: Secondary | ICD-10-CM | POA: Diagnosis not present

## 2022-12-02 DIAGNOSIS — M8589 Other specified disorders of bone density and structure, multiple sites: Secondary | ICD-10-CM | POA: Diagnosis not present

## 2022-12-03 ENCOUNTER — Ambulatory Visit: Payer: Self-pay

## 2022-12-04 NOTE — Telephone Encounter (Signed)
Lab orders placed and pt is aware

## 2022-12-07 ENCOUNTER — Other Ambulatory Visit: Payer: Self-pay | Admitting: Family Medicine

## 2022-12-07 DIAGNOSIS — R928 Other abnormal and inconclusive findings on diagnostic imaging of breast: Secondary | ICD-10-CM

## 2022-12-08 DIAGNOSIS — K080 Exfoliation of teeth due to systemic causes: Secondary | ICD-10-CM | POA: Diagnosis not present

## 2022-12-08 DIAGNOSIS — Z1329 Encounter for screening for other suspected endocrine disorder: Secondary | ICD-10-CM | POA: Diagnosis not present

## 2022-12-08 DIAGNOSIS — Z13220 Encounter for screening for lipoid disorders: Secondary | ICD-10-CM | POA: Diagnosis not present

## 2022-12-08 DIAGNOSIS — Z79899 Other long term (current) drug therapy: Secondary | ICD-10-CM | POA: Diagnosis not present

## 2022-12-09 LAB — CBC WITH DIFFERENTIAL/PLATELET
Basophils Absolute: 0 10*3/uL (ref 0.0–0.2)
Basos: 1 %
EOS (ABSOLUTE): 0.1 10*3/uL (ref 0.0–0.4)
Eos: 1 %
Hematocrit: 37.5 % (ref 34.0–46.6)
Hemoglobin: 12.6 g/dL (ref 11.1–15.9)
Immature Grans (Abs): 0 10*3/uL (ref 0.0–0.1)
Immature Granulocytes: 0 %
Lymphocytes Absolute: 1.7 10*3/uL (ref 0.7–3.1)
Lymphs: 34 %
MCH: 29.2 pg (ref 26.6–33.0)
MCHC: 33.6 g/dL (ref 31.5–35.7)
MCV: 87 fL (ref 79–97)
Monocytes Absolute: 0.4 10*3/uL (ref 0.1–0.9)
Monocytes: 8 %
Neutrophils Absolute: 2.8 10*3/uL (ref 1.4–7.0)
Neutrophils: 56 %
Platelets: 269 10*3/uL (ref 150–450)
RBC: 4.31 x10E6/uL (ref 3.77–5.28)
RDW: 12.2 % (ref 11.7–15.4)
WBC: 4.9 10*3/uL (ref 3.4–10.8)

## 2022-12-09 LAB — LIPID PANEL
Chol/HDL Ratio: 2.2 ratio (ref 0.0–4.4)
Cholesterol, Total: 197 mg/dL (ref 100–199)
HDL: 90 mg/dL (ref >39)
LDL Chol Calc (NIH): 96 mg/dL (ref 0–99)
Triglycerides: 57 mg/dL (ref 0–149)
VLDL Cholesterol Cal: 11 mg/dL (ref 5–40)

## 2022-12-09 LAB — TSH: TSH: 2.02 u[IU]/mL (ref 0.450–4.500)

## 2022-12-09 LAB — COMPREHENSIVE METABOLIC PANEL
ALT: 24 IU/L (ref 0–32)
AST: 24 IU/L (ref 0–40)
Albumin/Globulin Ratio: 2.2 (ref 1.2–2.2)
Albumin: 5 g/dL — ABNORMAL HIGH (ref 3.9–4.9)
Alkaline Phosphatase: 83 IU/L (ref 44–121)
BUN/Creatinine Ratio: 21 (ref 12–28)
BUN: 13 mg/dL (ref 8–27)
Bilirubin Total: 0.5 mg/dL (ref 0.0–1.2)
CO2: 22 mmol/L (ref 20–29)
Calcium: 9.3 mg/dL (ref 8.7–10.3)
Chloride: 98 mmol/L (ref 96–106)
Creatinine, Ser: 0.61 mg/dL (ref 0.57–1.00)
Globulin, Total: 2.3 g/dL (ref 1.5–4.5)
Glucose: 99 mg/dL (ref 70–99)
Potassium: 4.4 mmol/L (ref 3.5–5.2)
Sodium: 136 mmol/L (ref 134–144)
Total Protein: 7.3 g/dL (ref 6.0–8.5)
eGFR: 99 mL/min/{1.73_m2} (ref >59)

## 2022-12-14 ENCOUNTER — Ambulatory Visit
Admission: RE | Admit: 2022-12-14 | Discharge: 2022-12-14 | Disposition: A | Discharge status: Home / Self Care | Payer: Medicare Other | Source: Ambulatory Visit | Attending: Family Medicine | Admitting: Family Medicine

## 2022-12-14 ENCOUNTER — Ambulatory Visit: Payer: Medicare Other

## 2022-12-14 DIAGNOSIS — R922 Inconclusive mammogram: Secondary | ICD-10-CM | POA: Diagnosis not present

## 2022-12-14 DIAGNOSIS — R928 Other abnormal and inconclusive findings on diagnostic imaging of breast: Secondary | ICD-10-CM

## 2022-12-18 ENCOUNTER — Ambulatory Visit (INDEPENDENT_AMBULATORY_CARE_PROVIDER_SITE_OTHER): Payer: Medicare Other | Admitting: Nurse Practitioner

## 2022-12-18 VITALS — BP 118/62 | HR 80 | Ht 61.25 in | Wt 142.0 lb

## 2022-12-18 DIAGNOSIS — Z124 Encounter for screening for malignant neoplasm of cervix: Secondary | ICD-10-CM

## 2022-12-18 DIAGNOSIS — Z0001 Encounter for general adult medical examination with abnormal findings: Secondary | ICD-10-CM | POA: Diagnosis not present

## 2022-12-18 DIAGNOSIS — Z01419 Encounter for gynecological examination (general) (routine) without abnormal findings: Secondary | ICD-10-CM

## 2022-12-18 DIAGNOSIS — Z1151 Encounter for screening for human papillomavirus (HPV): Secondary | ICD-10-CM | POA: Diagnosis not present

## 2022-12-18 DIAGNOSIS — Z0000 Encounter for general adult medical examination without abnormal findings: Secondary | ICD-10-CM

## 2022-12-18 DIAGNOSIS — M8580 Other specified disorders of bone density and structure, unspecified site: Secondary | ICD-10-CM | POA: Diagnosis not present

## 2022-12-18 NOTE — Progress Notes (Unsigned)
Subjective:    Patient ID: Claire [name], female    DOB: 1956-01-31, 67 y.o.   MRN: [id number]  HPI Zanaiya is being seen in the office today for her annual wellness visit. Her diet is good. She does participate in exercise 3 times per week. She does not smoke, drink alcohol, or participate in any drug use. She does not have menstrual cycles. She is currently sexually active with one female partner in the last year. She is up to date on her mammogram and bone density exams. She does get regular eye and dental exams. She currently only takes supplements with no acute issues this visit. She did have a concern for some "scar tissue" that is near her gallbladder surgery scar. It causes her no pain or discomfort.   Review of Systems  Constitutional:  Negative for chills and fatigue.  HENT:  Negative for sore throat and trouble swallowing.   Respiratory:  Negative for cough, chest tightness, shortness of breath and wheezing.   Cardiovascular:  Negative for chest pain and palpitations.  Gastrointestinal:  Negative for constipation, diarrhea, nausea and vomiting.  Genitourinary:  Negative for difficulty urinating, dysuria and pelvic pain.  Psychiatric/Behavioral:  Negative for sleep disturbance.       Objective:   Physical Exam Vitals and nursing note reviewed.  Constitutional:      General: She is not in acute distress.    Appearance: Normal appearance. She is not ill-appearing.  Cardiovascular:     Rate and Rhythm: Normal rate and regular rhythm.     Heart sounds: No murmur heard. Pulmonary:     Effort: Pulmonary effort is normal. No respiratory distress.     Breath sounds: No wheezing.  Chest:  Breasts:    Right: Normal.     Left: Normal.     Comments: Breasts are normal bilaterally with no nodularities, masses or nipple changes.  Abdominal:     General: There is no distension.     Palpations: There is no mass.     Tenderness: There is no abdominal tenderness.      Comments: Small, reducible hernia felt in the epigastric region. No tenderness noted.   Genitourinary:    General: Normal vulva.     Labia:        Right: No tenderness or lesion.        Left: No tenderness or lesion.      Vagina: No vaginal discharge, tenderness, bleeding or lesions.     Cervix: No cervical motion tenderness or friability.     Uterus: Normal.   Musculoskeletal:     Cervical back: No tenderness.  Lymphadenopathy:     Cervical: No cervical adenopathy.  Skin:    General: Skin is warm and dry.  Neurological:     Mental Status: She is alert.  Psychiatric:        Mood and Affect: Mood normal.        Behavior: Behavior normal.        Thought Content: Thought content normal.        Judgment: Judgment normal.    Vitals:   12/18/22 1308  BP: 118/62  Pulse: 80  Height: 5' 1.25" (1.556 m)  Weight: 142 lb (64.4 kg)  SpO2: 98%  BMI (Calculated): 26.6   Results for orders placed or performed in visit on 11/30/22  CBC with Differential  Result Value Ref Range   WBC 4.9 3.4 - 10.8 x10E3/uL   RBC 4.31 3.77 - 5.28 x10E6/uL  Hemoglobin 12.6 11.1 - 15.9 g/dL   Hematocrit 37.5 34.0 - 46.6 %   MCV 87 79 - 97 fL   MCH 29.2 26.6 - 33.0 pg   MCHC 33.6 31.5 - 35.7 g/dL   RDW 12.2 11.7 - 15.4 %   Platelets 269 150 - 450 x10E3/uL   Neutrophils 56 Not Estab. %   Lymphs 34 Not Estab. %   Monocytes 8 Not Estab. %   Eos 1 Not Estab. %   Basos 1 Not Estab. %   Neutrophils Absolute 2.8 1.4 - 7.0 x10E3/uL   Lymphocytes Absolute 1.7 0.7 - 3.1 x10E3/uL   Monocytes Absolute 0.4 0.1 - 0.9 x10E3/uL   EOS (ABSOLUTE) 0.1 0.0 - 0.4 x10E3/uL   Basophils Absolute 0.0 0.0 - 0.2 x10E3/uL   Immature Granulocytes 0 Not Estab. %   Immature Grans (Abs) 0.0 0.0 - 0.1 x10E3/uL  Comprehensive metabolic panel  Result Value Ref Range   Glucose 99 70 - 99 mg/dL   BUN 13 8 - 27 mg/dL   Creatinine, Ser 0.61 0.57 - 1.00 mg/dL   eGFR 99 >59 mL/min/1.73   BUN/Creatinine Ratio 21 12 - 28   Sodium  136 134 - 144 mmol/L   Potassium 4.4 3.5 - 5.2 mmol/L   Chloride 98 96 - 106 mmol/L   CO2 22 20 - 29 mmol/L   Calcium 9.3 8.7 - 10.3 mg/dL   Total Protein 7.3 6.0 - 8.5 g/dL   Albumin 5.0 (H) 3.9 - 4.9 g/dL   Globulin, Total 2.3 1.5 - 4.5 g/dL   Albumin/Globulin Ratio 2.2 1.2 - 2.2   Bilirubin Total 0.5 0.0 - 1.2 mg/dL   Alkaline Phosphatase 83 44 - 121 IU/L   AST 24 0 - 40 IU/L   ALT 24 0 - 32 IU/L  Lipid panel  Result Value Ref Range   Cholesterol, Total 197 100 - 199 mg/dL   Triglycerides 57 0 - 149 mg/dL   HDL 90 >39 mg/dL   VLDL Cholesterol Cal 11 5 - 40 mg/dL   LDL Chol Calc (NIH) 96 0 - 99 mg/dL   Chol/HDL Ratio 2.2 0.0 - 4.4 ratio  TSH  Result Value Ref Range   TSH 2.020 0.450 - 4.500 uIU/mL    The 10-year ASCVD risk score (Arnett DK, et al., 2019) is: 3.8%   Values used to calculate the score:     Age: 54 years     Sex: Female     Is Non-Hispanic African American: No     Diabetic: No     Tobacco smoker: No     Systolic Blood Pressure: 123456 mmHg     Is BP treated: No     HDL Cholesterol: 90 mg/dL     Total Cholesterol: 197 mg/dL     Assessment & Plan:  1. Well woman exam with routine gynecological exam - IGP, Aptima HPV -Encouraged patient to get Tdap, Shingrix, Pneumonia and Flu vaccines. Patient deferred at this visit. -Talked with patient about her epigastric hernia, and educated her on warning signs on when to get seen emergently.   2. Screening for HPV (human papillomavirus) - IGP, Aptima HPV  3. Screening for cervical cancer - IGP, Aptima HPV

## 2022-12-18 NOTE — Patient Instructions (Addendum)
Recommended Tdap, Shingrix, Pneumonia, and Influenza vaccine. Patient deferred. Recommended HIV and Hepatitis C Screening.

## 2022-12-18 NOTE — Progress Notes (Unsigned)
   Subjective:    Patient ID: Claire [name], female    DOB: 1956/09/17, 67 y.o.   MRN: [id number]  HPI The patient comes in today for a wellness visit.    A review of their health history was completed.  A review of medications was also completed.  Any needed refills; no  Eating habits: healthy  Falls/  MVA accidents in past few months: no  Regular exercise: work out x 5 times a week  Specialist pt sees on regular basis: no  Preventative health issues were discussed.   Additional concerns: no concerns or issues today    Review of Systems     Objective:   Physical Exam        Assessment & Plan:

## 2022-12-19 ENCOUNTER — Encounter: Payer: Self-pay | Admitting: Nurse Practitioner

## 2022-12-19 DIAGNOSIS — M8580 Other specified disorders of bone density and structure, unspecified site: Secondary | ICD-10-CM | POA: Insufficient documentation

## 2022-12-23 LAB — IGP, APTIMA HPV: HPV Aptima: NEGATIVE

## 2022-12-31 ENCOUNTER — Encounter: Payer: Self-pay | Admitting: Nurse Practitioner

## 2023-02-16 DIAGNOSIS — H52203 Unspecified astigmatism, bilateral: Secondary | ICD-10-CM | POA: Diagnosis not present

## 2023-02-16 DIAGNOSIS — H524 Presbyopia: Secondary | ICD-10-CM | POA: Diagnosis not present

## 2023-02-16 DIAGNOSIS — H5203 Hypermetropia, bilateral: Secondary | ICD-10-CM | POA: Diagnosis not present

## 2023-02-16 DIAGNOSIS — H25813 Combined forms of age-related cataract, bilateral: Secondary | ICD-10-CM | POA: Diagnosis not present

## 2023-06-10 DIAGNOSIS — K080 Exfoliation of teeth due to systemic causes: Secondary | ICD-10-CM | POA: Diagnosis not present

## 2023-07-23 DIAGNOSIS — H9203 Otalgia, bilateral: Secondary | ICD-10-CM | POA: Diagnosis not present

## 2023-07-23 DIAGNOSIS — H900 Conductive hearing loss, bilateral: Secondary | ICD-10-CM | POA: Diagnosis not present

## 2023-07-23 DIAGNOSIS — H6123 Impacted cerumen, bilateral: Secondary | ICD-10-CM | POA: Diagnosis not present

## 2023-09-22 DIAGNOSIS — K080 Exfoliation of teeth due to systemic causes: Secondary | ICD-10-CM | POA: Diagnosis not present

## 2023-10-12 DIAGNOSIS — K080 Exfoliation of teeth due to systemic causes: Secondary | ICD-10-CM | POA: Diagnosis not present

## 2023-12-22 DIAGNOSIS — K080 Exfoliation of teeth due to systemic causes: Secondary | ICD-10-CM | POA: Diagnosis not present

## 2024-03-06 ENCOUNTER — Encounter: Payer: Self-pay | Admitting: Family Medicine

## 2024-03-06 ENCOUNTER — Other Ambulatory Visit: Payer: Self-pay | Admitting: Family Medicine

## 2024-03-06 DIAGNOSIS — Z1231 Encounter for screening mammogram for malignant neoplasm of breast: Secondary | ICD-10-CM

## 2024-03-07 ENCOUNTER — Ambulatory Visit
Admission: RE | Admit: 2024-03-07 | Discharge: 2024-03-07 | Disposition: A | Discharge status: Home / Self Care | Source: Ambulatory Visit | Attending: Family Medicine | Admitting: Family Medicine

## 2024-03-07 DIAGNOSIS — Z1231 Encounter for screening mammogram for malignant neoplasm of breast: Secondary | ICD-10-CM | POA: Diagnosis not present

## 2024-07-03 DIAGNOSIS — K080 Exfoliation of teeth due to systemic causes: Secondary | ICD-10-CM | POA: Diagnosis not present

## 2024-07-21 ENCOUNTER — Ambulatory Visit (INDEPENDENT_AMBULATORY_CARE_PROVIDER_SITE_OTHER): Payer: Medicare Other | Admitting: Otolaryngology

## 2024-07-21 ENCOUNTER — Encounter (INDEPENDENT_AMBULATORY_CARE_PROVIDER_SITE_OTHER): Payer: Self-pay | Admitting: Otolaryngology

## 2024-07-21 VITALS — BP 125/71 | HR 52

## 2024-07-21 DIAGNOSIS — H6123 Impacted cerumen, bilateral: Secondary | ICD-10-CM | POA: Diagnosis not present

## 2024-07-21 NOTE — Progress Notes (Signed)
 Patient ID: Claire [name], female   DOB: 1956/05/13, 68 y.o.   MRN: [id number]  Procedure: Bilateral cerumen disimpaction.   Indication: Cerumen impaction, resulting in ear discomfort and conductive hearing loss.   Description: The patient is placed supine on the operating table. Under the operating microscope, the right ear canal is examined and is noted to be impacted with cerumen. The cerumen is carefully removed with a combination of suction catheters, cerumen curette, and alligator forceps. After the cerumen removal, the ear canal and tympanic membrane are noted to be normal. No middle ear effusion is noted. The same procedure is then repeated on the left side without exception. The patient tolerated the procedure well.  Follow-up care:  The patient is instructed not to use Q-tips to clean the ear canals. The patient will follow up in 12 months.

## 2024-07-26 ENCOUNTER — Telehealth: Payer: Self-pay | Admitting: *Deleted

## 2024-07-26 ENCOUNTER — Other Ambulatory Visit: Payer: Self-pay | Admitting: Physician Assistant

## 2024-07-26 DIAGNOSIS — Z13220 Encounter for screening for lipoid disorders: Secondary | ICD-10-CM

## 2024-07-26 DIAGNOSIS — M8580 Other specified disorders of bone density and structure, unspecified site: Secondary | ICD-10-CM

## 2024-07-26 DIAGNOSIS — Z0000 Encounter for general adult medical examination without abnormal findings: Secondary | ICD-10-CM

## 2024-07-26 NOTE — Telephone Encounter (Signed)
 Patient notified

## 2024-07-26 NOTE — Telephone Encounter (Signed)
 [name], Claire, [place]     07/26/24  3:17 PM Labs ordered.

## 2024-07-26 NOTE — Telephone Encounter (Signed)
 Telephone call no answer

## 2024-07-26 NOTE — Telephone Encounter (Signed)
 Copied from CRM #[id number]. Topic: Clinical - Request for Lab/Test Order >> Jul 26, 2024  9:10 AM Claire [name] wrote: Reason for CRM: Claire [name] called in to schedule physical for Monday 09/22, Claire [name] would like to get orders done before visit on Monday. Ppt would like to add Vitamin D  to be tested in blood work.    Claire [name] would like to come Thursday or Friday for blood work.

## 2024-07-27 DIAGNOSIS — Z13220 Encounter for screening for lipoid disorders: Secondary | ICD-10-CM | POA: Diagnosis not present

## 2024-07-27 DIAGNOSIS — M8580 Other specified disorders of bone density and structure, unspecified site: Secondary | ICD-10-CM | POA: Diagnosis not present

## 2024-07-28 ENCOUNTER — Ambulatory Visit: Payer: Self-pay | Admitting: Physician Assistant

## 2024-07-28 ENCOUNTER — Ambulatory Visit

## 2024-07-28 LAB — LIPID PANEL
Chol/HDL Ratio: 2.2 ratio (ref 0.0–4.4)
Cholesterol, Total: 210 mg/dL — ABNORMAL HIGH (ref 100–199)
HDL: 97 mg/dL (ref >39)
LDL Chol Calc (NIH): 104 mg/dL — ABNORMAL HIGH (ref 0–99)
Triglycerides: 50 mg/dL (ref 0–149)
VLDL Cholesterol Cal: 9 mg/dL (ref 5–40)

## 2024-07-28 LAB — CBC WITH DIFFERENTIAL/PLATELET
Basophils Absolute: 0 x10E3/uL (ref 0.0–0.2)
Basos: 1 %
EOS (ABSOLUTE): 0 x10E3/uL (ref 0.0–0.4)
Eos: 1 %
Hematocrit: 40.7 % (ref 34.0–46.6)
Hemoglobin: 13.2 g/dL (ref 11.1–15.9)
Immature Grans (Abs): 0 x10E3/uL (ref 0.0–0.1)
Immature Granulocytes: 0 %
Lymphocytes Absolute: 1.4 x10E3/uL (ref 0.7–3.1)
Lymphs: 34 %
MCH: 29.1 pg (ref 26.6–33.0)
MCHC: 32.4 g/dL (ref 31.5–35.7)
MCV: 90 fL (ref 79–97)
Monocytes Absolute: 0.3 x10E3/uL (ref 0.1–0.9)
Monocytes: 7 %
Neutrophils Absolute: 2.3 x10E3/uL (ref 1.4–7.0)
Neutrophils: 57 %
Platelets: 267 x10E3/uL (ref 150–450)
RBC: 4.54 x10E6/uL (ref 3.77–5.28)
RDW: 12.3 % (ref 11.7–15.4)
WBC: 4 x10E3/uL (ref 3.4–10.8)

## 2024-07-28 LAB — CMP14+EGFR
ALT: 23 IU/L (ref 0–32)
AST: 28 IU/L (ref 0–40)
Albumin: 4.8 g/dL (ref 3.9–4.9)
Alkaline Phosphatase: 71 IU/L (ref 49–135)
BUN/Creatinine Ratio: 17 (ref 12–28)
BUN: 12 mg/dL (ref 8–27)
Bilirubin Total: 0.5 mg/dL (ref 0.0–1.2)
CO2: 23 mmol/L (ref 20–29)
Calcium: 9.5 mg/dL (ref 8.7–10.3)
Chloride: 95 mmol/L — ABNORMAL LOW (ref 96–106)
Creatinine, Ser: 0.71 mg/dL (ref 0.57–1.00)
Globulin, Total: 2.4 g/dL (ref 1.5–4.5)
Glucose: 93 mg/dL (ref 70–99)
Potassium: 4.6 mmol/L (ref 3.5–5.2)
Sodium: 135 mmol/L (ref 134–144)
Total Protein: 7.2 g/dL (ref 6.0–8.5)
eGFR: 94 mL/min/1.73 (ref >59)

## 2024-07-28 LAB — VITAMIN D 25 HYDROXY (VIT D DEFICIENCY, FRACTURES): Vit D, 25-Hydroxy: 65.3 ng/mL (ref 30.0–100.0)

## 2024-07-31 ENCOUNTER — Ambulatory Visit (INDEPENDENT_AMBULATORY_CARE_PROVIDER_SITE_OTHER): Admitting: Physician Assistant

## 2024-07-31 ENCOUNTER — Encounter: Admitting: Physician Assistant

## 2024-07-31 ENCOUNTER — Encounter: Payer: Self-pay | Admitting: Physician Assistant

## 2024-07-31 VITALS — BP 131/76 | Ht 61.5 in | Wt 136.4 lb

## 2024-07-31 DIAGNOSIS — Z0000 Encounter for general adult medical examination without abnormal findings: Secondary | ICD-10-CM

## 2024-07-31 NOTE — Progress Notes (Signed)
 Subjective:   Claire [name] is a 68 y.o. female who presents for Medicare Annual (Subsequent) preventive examination.  Visit Complete: In person        Objective:    Today's Vitals   07/31/24 1107  BP: 131/76  Weight: 136 lb 6.4 oz (61.9 kg)  Height: 5' 1.5 (1.562 m)   Body mass index is 25.36 kg/m.     07/31/2024   11:35 AM 04/24/2016    9:28 AM 04/23/2016    8:00 PM 04/23/2016    3:17 PM  Advanced Directives  Does Patient Have a Medical Advance Directive? Yes  Yes  No   Type of Estate agent of [place];Living will Living will  Living will    Does patient want to make changes to medical advance directive? No - Patient declined No - Patient declined  No - Patient declined    Copy of Healthcare Power of Attorney in Chart?   No - copy requested       Data saved with a previous flowsheet row definition    Current Medications (verified) Outpatient Encounter Medications as of 07/31/2024  Medication Sig   Chelated Magnesium 100 MG TABS Take 100 mg by mouth daily.   Cholecalciferol (VITAMIN D3) 125 MCG (5000 UT) CAPS    fish oil-omega-3 fatty acids 1000 MG capsule Take 1 g by mouth daily.   GLUCOSAMINE PO Take 1 tablet by mouth 2 (two) times daily.    Multiple Vitamin (MULTIVITAMIN) tablet Take 1 tablet by mouth daily.   No facility-administered encounter medications on file as of 07/31/2024.    Allergies (verified) Patient has no known allergies.   History: Past Medical History:  Diagnosis Date   Arthritis    Cancer [facility]) Feb 2023   Melanoma in situ   Cataract 2022   Heart murmur 2010   Vasovagal syncope 09/02/2018   Patient has severe vasovagal syncope with painful stimuli or sometimes with medical procedures.   Past Surgical History:  Procedure Laterality Date   CESAREAN SECTION     CHOLECYSTECTOMY N/A 04/24/2016   Procedure: LAPAROSCOPIC CHOLECYSTECTOMY;  Surgeon: Oneil Budge, MD;  Location: [facility];  Service: General;   Laterality: N/A;   COLONOSCOPY  March 2010   normal repeat 10 years   Family History  Problem Relation Age of Onset   Hyperlipidemia Father        died of CAD at age 87   Arthritis Father    Heart disease Father    Kidney disease Father    Breast cancer Maternal Grandmother    Cancer Maternal Grandmother        breast   Cancer Paternal Grandfather        colon   Social History   Socioeconomic History   Marital status: Married    Spouse name: Not on file   Number of children: Not on file   Years of education: Not on file   Highest education level: Not on file  Occupational History   Not on file  Tobacco Use   Smoking status: Never   Smokeless tobacco: Never  Vaping Use   Vaping status: Never Used  Substance and Sexual Activity   Alcohol use: No   Drug use: No   Sexual activity: Yes    Birth control/protection: None  Other Topics Concern   Not on file  Social History Narrative   Not on file   Social Drivers of Health   Financial Resource Strain: Not on file  Food Insecurity: Not on file  Transportation Needs: Not on file  Physical Activity: Not on file  Stress: Not on file  Social Connections: Unknown (03/24/2022)   Received from [facility]   Social Network    Social Network: Not on file    Tobacco Counseling Counseling given: No   Activities of Daily Living    07/31/2024   11:38 AM  In your present state of health, do you have any difficulty performing the following activities:  Hearing? 0  Vision? 0  Difficulty concentrating or making decisions? 0  Walking or climbing stairs? 0  Dressing or bathing? 0  Doing errands, shopping? 0    Patient Care Team: Alphonsa Glendia LABOR, MD as PCP - General (Family Medicine) [place], Andrez SAUNDERS, PA-C (Inactive) as Physician Assistant (Dermatology)  Indicate any recent Medical Services you may have received from other than [facility] providers in the past year (date may be approximate).     Assessment:   This is a  routine wellness examination for [place].  Hearing/Vision screen No results found.   Goals Addressed   None    Depression Screen    07/31/2024   11:07 AM 12/18/2022    1:14 PM 10/02/2019    2:46 PM  PHQ 2/9 Scores  PHQ - 2 Score 0 0 0  PHQ- 9 Score  0     Fall Risk    07/31/2024   11:07 AM 12/18/2022    2:08 PM 10/02/2019    2:44 PM  Fall Risk   Falls in the past year? 0 0 0   Number falls in past yr:  0 0   Injury with Fall?  0   Risk for fall due to : No Fall Risks    Follow up Falls evaluation completed       Data saved with a previous flowsheet row definition    MEDICARE RISK AT HOME:    TIMED UP AND GO:  Was the test performed?  Yes  Gait steady and fast without use of assistive device    Cognitive Function: No concerns for cognitive function. Patient can completes ADLs without assistance. Drives and transports herself without issues. On time and appropriate for all appointments.         Immunizations Immunization History  Administered Date(s) Administered   Pneumococcal Polysaccharide-23 10/17/2022    TDAP status: Due, Education has been provided regarding the importance of this vaccine. Advised may receive this vaccine at local pharmacy or Health Dept. Aware to provide a copy of the vaccination record if obtained from local pharmacy or Health Dept. Verbalized acceptance and understanding.  Flu Vaccine status: Declined, Education has been provided regarding the importance of this vaccine but patient still declined. Advised may receive this vaccine at local pharmacy or Health Dept. Aware to provide a copy of the vaccination record if obtained from local pharmacy or Health Dept. Verbalized acceptance and understanding.  Pneumococcal vaccine status: Due, Education has been provided regarding the importance of this vaccine. Advised may receive this vaccine at local pharmacy or Health Dept. Aware to provide a copy of the vaccination record if obtained from local  pharmacy or Health Dept. Verbalized acceptance and understanding.  Covid-19 vaccine status: Completed vaccines  Qualifies for Shingles Vaccine? Yes   Zostavax completed No   Shingrix  Completed?: No.    Education has been provided regarding the importance of this vaccine. Patient has been advised to call insurance company to determine out of pocket expense if they have  not yet received this vaccine. Advised may also receive vaccine at local pharmacy or Health Dept. Verbalized acceptance and understanding.  Screening Tests Health Maintenance  Topic Date Due   Hepatitis C Screening  Never done   Zoster Vaccines- Shingrix  (1 of 2) Never done   COVID-19 Vaccine (1) 08/16/2024 (Originally 10/17/1961)   Influenza Vaccine  02/06/2025 (Originally 06/09/2024)   DTaP/Tdap/Td (1 - Tdap) 07/31/2025 (Originally 10/17/1975)   Pneumococcal Vaccine: 50+ Years (2 of 2 - PCV) 07/31/2025 (Originally 10/18/2023)   DEXA SCAN  12/02/2024   Mammogram  03/07/2025   Medicare Annual Wellness (AWV)  07/31/2025   Colonoscopy  10/18/2030   HPV VACCINES  Aged Out   Meningococcal B Vaccine  Aged Out    Health Maintenance  Health Maintenance Due  Topic Date Due   Hepatitis C Screening  Never done   Zoster Vaccines- Shingrix  (1 of 2) Never done    Colorectal cancer screening: Type of screening: Colonoscopy. Completed 2021. Repeat every 10 years  Mammogram status: Completed April 2025. Repeat every year  Bone Density status: Completed 2024. Results reflect: Bone density results: OSTEOPENIA. Repeat every 2 years.  Lung Cancer Screening: (Low Dose CT Chest recommended if Age 6-80 years, 20 pack-year currently smoking OR have quit w/in 15years.) does not qualify.   Lung Cancer Screening Referral: n/a, non smoker   Additional Screening:  Hepatitis C Screening: does not qualify; Completed n/a  Vision Screening: Recommended annual ophthalmology exams for early detection of glaucoma and other disorders of the  eye. Is the patient up to date with their annual eye exam?  Yes   Dental Screening: Recommended annual dental exams for proper oral hygiene  Diabetic Foot Exam: n/a, not a diabetic   Community Resource Referral / Chronic Care Management: CRR required this visit?  No   CCM required this visit?  No     Plan:     I have personally reviewed and noted the following in the patient's chart:   Medical and social history Use of alcohol, tobacco or illicit drugs  Current medications and supplements including opioid prescriptions. Patient is not currently taking opioid prescriptions. Functional ability and status Nutritional status Physical activity Advanced directives List of other physicians Hospitalizations, surgeries, and ER visits in previous 12 months Vitals Screenings to include cognitive, depression, and falls Referrals and appointments  In addition, I have reviewed and discussed with patient certain preventive protocols, quality metrics, and best practice recommendations. A written personalized care plan for preventive services as well as general preventive health recommendations were provided to patient.     Charmaine Ysabel Cowgill, PA-C   07/31/2024   After Visit Summary: (In Person-Declined) Patient declined AVS at this time.
